# Patient Record
Sex: Male | Born: 1940
Health system: Southern US, Community
[De-identification: ages and names within clinical notes are randomized; demographics above are authoritative.]

## PROBLEM LIST (undated history)

## (undated) DIAGNOSIS — E785 Hyperlipidemia, unspecified: Secondary | ICD-10-CM

## (undated) DIAGNOSIS — I2510 Atherosclerotic heart disease of native coronary artery without angina pectoris: Secondary | ICD-10-CM

## (undated) DIAGNOSIS — I671 Cerebral aneurysm, nonruptured: Secondary | ICD-10-CM

## (undated) DIAGNOSIS — I10 Essential (primary) hypertension: Secondary | ICD-10-CM

## (undated) DIAGNOSIS — I252 Old myocardial infarction: Secondary | ICD-10-CM

## (undated) HISTORY — DX: Atherosclerotic heart disease of native coronary artery without angina pectoris: I25.10

## (undated) HISTORY — DX: Hyperlipidemia, unspecified: E78.5

## (undated) HISTORY — DX: Cerebral aneurysm, nonruptured: I67.1

## (undated) HISTORY — DX: Old myocardial infarction: I25.2

## (undated) HISTORY — PX: LUMBAR DISC SURGERY: SHX700

## (undated) HISTORY — PX: MASTECTOMY: SHX3

## (undated) HISTORY — DX: Essential (primary) hypertension: I10

## (undated) HISTORY — PX: CORONARY STENT PLACEMENT: SHX1402

## (undated) HISTORY — PX: HERNIA REPAIR: SHX51

---

## 2002-07-02 HISTORY — PX: CARDIOVASCULAR STRESS TEST: SHX262

## 2003-02-07 HISTORY — PX: CORONARY STENT PLACEMENT: SHX1402

## 2006-10-12 HISTORY — PX: CARDIAC CATHETERIZATION: SHX172

## 2008-11-23 HISTORY — PX: TRANSTHORACIC ECHOCARDIOGRAM: SHX275

## 2009-07-16 ENCOUNTER — Inpatient Hospital Stay (HOSPITAL_COMMUNITY): Admission: RE | Admit: 2009-07-16 | Discharge: 2009-07-19 | Payer: Self-pay | Admitting: Cardiology

## 2009-07-16 HISTORY — PX: CARDIAC CATHETERIZATION: SHX172

## 2009-10-25 ENCOUNTER — Ambulatory Visit: Payer: Self-pay | Admitting: Cardiology

## 2010-02-28 ENCOUNTER — Ambulatory Visit: Payer: Self-pay | Admitting: Cardiology

## 2010-04-25 LAB — URINALYSIS, ROUTINE W REFLEX MICROSCOPIC
Bilirubin Urine: NEGATIVE
Glucose, UA: NEGATIVE mg/dL
Ketones, ur: NEGATIVE mg/dL
Leukocytes, UA: NEGATIVE
Nitrite: NEGATIVE
Protein, ur: NEGATIVE mg/dL
Specific Gravity, Urine: 1.018 (ref 1.005–1.030)
Urobilinogen, UA: 0.2 mg/dL (ref 0.0–1.0)
pH: 7 (ref 5.0–8.0)

## 2010-04-25 LAB — COMPREHENSIVE METABOLIC PANEL
ALT: 20 U/L (ref 0–53)
AST: 24 U/L (ref 0–37)
Albumin: 3.7 g/dL (ref 3.5–5.2)
Alkaline Phosphatase: 50 U/L (ref 39–117)
BUN: 13 mg/dL (ref 6–23)
CO2: 26 mEq/L (ref 19–32)
Calcium: 8.5 mg/dL (ref 8.4–10.5)
Chloride: 109 mEq/L (ref 96–112)
Creatinine, Ser: 1.52 mg/dL — ABNORMAL HIGH (ref 0.4–1.5)
GFR calc Af Amer: 55 mL/min — ABNORMAL LOW (ref >60)
GFR calc non Af Amer: 46 mL/min — ABNORMAL LOW (ref >60)
Glucose, Bld: 163 mg/dL — ABNORMAL HIGH (ref 70–99)
Potassium: 4.1 mEq/L (ref 3.5–5.1)
Sodium: 138 mEq/L (ref 135–145)
Total Bilirubin: 0.4 mg/dL (ref 0.3–1.2)
Total Protein: 6 g/dL (ref 6.0–8.3)

## 2010-04-25 LAB — CARDIAC PANEL(CRET KIN+CKTOT+MB+TROPI)
CK, MB: 11.9 ng/mL (ref 0.3–4.0)
CK, MB: 62.4 ng/mL (ref 0.3–4.0)
CK, MB: 84.5 ng/mL (ref 0.3–4.0)
Relative Index: 14.4 — ABNORMAL HIGH (ref 0.0–2.5)
Relative Index: 14.6 — ABNORMAL HIGH (ref 0.0–2.5)
Relative Index: 9.1 — ABNORMAL HIGH (ref 0.0–2.5)
Total CK: 131 U/L (ref 7–232)
Total CK: 432 U/L — ABNORMAL HIGH (ref 7–232)
Total CK: 577 U/L — ABNORMAL HIGH (ref 7–232)
Troponin I: 0.94 ng/mL (ref 0.00–0.06)
Troponin I: 11.18 ng/mL (ref 0.00–0.06)
Troponin I: 11.9 ng/mL (ref 0.00–0.06)

## 2010-04-25 LAB — URINE MICROSCOPIC-ADD ON

## 2010-04-25 LAB — BASIC METABOLIC PANEL
BUN: 12 mg/dL (ref 6–23)
CO2: 29 mEq/L (ref 19–32)
Calcium: 8.7 mg/dL (ref 8.4–10.5)
Chloride: 101 mEq/L (ref 96–112)
Creatinine, Ser: 1.21 mg/dL (ref 0.4–1.5)
GFR calc Af Amer: >60 mL/min (ref >60)
GFR calc non Af Amer: 59 mL/min — ABNORMAL LOW (ref >60)
Glucose, Bld: 104 mg/dL — ABNORMAL HIGH (ref 70–99)
Potassium: 3.8 mEq/L (ref 3.5–5.1)
Sodium: 136 mEq/L (ref 135–145)

## 2010-04-25 LAB — LIPID PANEL
Cholesterol: 143 mg/dL (ref 0–200)
HDL: 56 mg/dL (ref >39)
LDL Cholesterol: 80 mg/dL (ref 0–99)
Total CHOL/HDL Ratio: 2.6 RATIO
Triglycerides: 33 mg/dL (ref <150)
VLDL: 7 mg/dL (ref 0–40)

## 2010-04-25 LAB — CBC
HCT: 36.8 % — ABNORMAL LOW (ref 39.0–52.0)
HCT: 38.8 % — ABNORMAL LOW (ref 39.0–52.0)
Hemoglobin: 12.7 g/dL — ABNORMAL LOW (ref 13.0–17.0)
Hemoglobin: 13.1 g/dL (ref 13.0–17.0)
MCHC: 33.6 g/dL (ref 30.0–36.0)
MCHC: 34.6 g/dL (ref 30.0–36.0)
MCV: 91.2 fL (ref 78.0–100.0)
MCV: 92 fL (ref 78.0–100.0)
Platelets: 240 10*3/uL (ref 150–400)
Platelets: 265 10*3/uL (ref 150–400)
RBC: 4.03 MIL/uL — ABNORMAL LOW (ref 4.22–5.81)
RBC: 4.22 MIL/uL (ref 4.22–5.81)
RDW: 14.2 % (ref 11.5–15.5)
RDW: 14.5 % (ref 11.5–15.5)
WBC: 11 10*3/uL — ABNORMAL HIGH (ref 4.0–10.5)
WBC: 18.8 10*3/uL — ABNORMAL HIGH (ref 4.0–10.5)

## 2010-04-25 LAB — HEMOGLOBIN A1C
Hgb A1c MFr Bld: 6.1 % — ABNORMAL HIGH (ref <5.7)
Mean Plasma Glucose: 128 mg/dL — ABNORMAL HIGH (ref <117)

## 2010-04-25 LAB — MRSA PCR SCREENING: MRSA by PCR: NEGATIVE

## 2010-05-03 ENCOUNTER — Other Ambulatory Visit: Payer: Self-pay | Admitting: *Deleted

## 2010-05-03 NOTE — Telephone Encounter (Signed)
Wife called requesting refills on protonix and metoprolol. Advised refilled 05/02/10

## 2010-06-21 ENCOUNTER — Telehealth: Payer: Self-pay | Admitting: Cardiology

## 2010-06-21 NOTE — Telephone Encounter (Signed)
Wife called stating he has had some issues with lungs. His doctor wanted to know if Dr. Sw[place]iland would change his Metoprolol to another med because of his lung issues. Advised that would check w/ Dr. Swaziland and call her back. Advised Dr. Swaziland not in office today.

## 2010-06-21 NOTE — Telephone Encounter (Signed)
HE IS HAVING ISSUES WITH LUNGS. SHOULD HE COME OFF BETA BLOCKERS IF SO IS THERE SOMETHING THAT WILL DO HIM JUST AS GOOD? LUNG DR.  HAS PUT HIM ON MORE MEDS.,PRESIDONE (TAPER), 4 INHALERS (FLOVENT HSA, ATROVENT HSA, SYMBICORT 160-4.5,STIRVIA , ANTIBOTIC ( AZITHROMYCIN 250MG ). CHEST X-RAY AND SHOWED LOWER AND UPPER RIGHT LUNG NODULES. GOING BACK ON June 15 FOR CT.

## 2010-06-24 ENCOUNTER — Telehealth: Payer: Self-pay | Admitting: *Deleted

## 2010-06-24 NOTE — Telephone Encounter (Signed)
Spoke w/ Dr. [place]iland who stated he could stop Metoprolol. Spoke w/ wife who states she and her husband have decided not to stop medication.  Advised to continue to monitor BP and HR. Will call back if has further questions.

## 2010-08-05 ENCOUNTER — Encounter: Payer: Self-pay | Admitting: Cardiology

## 2010-08-24 ENCOUNTER — Other Ambulatory Visit: Payer: Self-pay | Admitting: Cardiology

## 2010-08-24 NOTE — Telephone Encounter (Signed)
escribe medication per fax request  

## 2010-09-29 ENCOUNTER — Encounter: Payer: Self-pay | Admitting: Cardiology

## 2010-09-29 ENCOUNTER — Ambulatory Visit (INDEPENDENT_AMBULATORY_CARE_PROVIDER_SITE_OTHER): Payer: Medicare Other | Admitting: Cardiology

## 2010-09-29 VITALS — BP 120/70 | HR 48 | Ht 71.0 in | Wt 173.6 lb

## 2010-09-29 DIAGNOSIS — I671 Cerebral aneurysm, nonruptured: Secondary | ICD-10-CM | POA: Insufficient documentation

## 2010-09-29 DIAGNOSIS — I252 Old myocardial infarction: Secondary | ICD-10-CM | POA: Insufficient documentation

## 2010-09-29 DIAGNOSIS — E785 Hyperlipidemia, unspecified: Secondary | ICD-10-CM | POA: Insufficient documentation

## 2010-09-29 DIAGNOSIS — I2510 Atherosclerotic heart disease of native coronary artery without angina pectoris: Secondary | ICD-10-CM

## 2010-09-29 DIAGNOSIS — I10 Essential (primary) hypertension: Secondary | ICD-10-CM

## 2010-09-29 NOTE — Patient Instructions (Signed)
We will schedule you for a nuclear stress test.  Continue your current medications except hold your metoprolol the day of your stress test.   I will see you again in 6 months and check fasting lab work then.

## 2010-09-29 NOTE — Assessment & Plan Note (Signed)
Blood pressure is well-controlled on amlodipine and metoprolol. °

## 2010-09-29 NOTE — Assessment & Plan Note (Signed)
Status post multiple stenting procedures as noted in the overview. I recommended a followup nuclear stress test to follow up on his coronary disease. He will continue with dual antiplatelet therapy.

## 2010-09-29 NOTE — Assessment & Plan Note (Signed)
Lipid parameters in January were acceptable. He will continue on simvastatin.
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
## 2010-10-06 ENCOUNTER — Ambulatory Visit (HOSPITAL_COMMUNITY): Payer: Medicare Other | Attending: Cardiology | Admitting: Radiology

## 2010-10-06 VITALS — Ht 71.0 in | Wt 171.0 lb

## 2010-10-06 DIAGNOSIS — I2510 Atherosclerotic heart disease of native coronary artery without angina pectoris: Secondary | ICD-10-CM | POA: Insufficient documentation

## 2010-10-06 DIAGNOSIS — R079 Chest pain, unspecified: Secondary | ICD-10-CM

## 2010-10-06 DIAGNOSIS — R0989 Other specified symptoms and signs involving the circulatory and respiratory systems: Secondary | ICD-10-CM

## 2010-10-06 MED ORDER — TECHNETIUM TC 99M TETROFOSMIN IV KIT
11.0000 | PACK | Freq: Once | INTRAVENOUS | Status: AC | PRN
  Administered 2010-10-06: 11 via INTRAVENOUS

## 2010-10-06 MED ORDER — TECHNETIUM TC 99M TETROFOSMIN IV KIT
33.0000 | PACK | Freq: Once | INTRAVENOUS | Status: AC | PRN
  Administered 2010-10-06: 33 via INTRAVENOUS

## 2010-10-06 MED ORDER — REGADENOSON 0.4 MG/5ML IV SOLN
0.4000 mg | Freq: Once | INTRAVENOUS | Status: AC
  Administered 2010-10-06: 0.4 mg via INTRAVENOUS

## 2010-10-06 NOTE — Progress Notes (Signed)
[facility] SITE 3 NUCLEAR MED [address] Kristopher Gonzalez [phone]  Cardiology Nuclear Med Kristopher Gonzalez is a 70 y.o. male [id number] 10/22/1940   Nuclear Med Background Indication for Stress Test:  Evaluation for Ischemia and Stent Patency History:  Asthma,5/10 Echo-EF-55% ,6/11 Heart Catheterization-EF 65%, Myocardial Infarction x 4, 2004 Myocardial Perfusion Study and 6/11 Stents-RCA Cardiac Risk Factors: History of Smoking, Hypertension and Lipids  Symptoms:  Chest Pain (last date of chest discomfort >2 weeks ago) and DOE   Nuclear Pre-Procedure Caffeine/Decaff Intake:  None NPO After: 6:30am   Lungs:  Clear IV 0.9% NS with Angio Cath:  20g  IV Site: L Forearm  IV Started by:  Stanton Kidney, EMT-P  Chest Size (in):  42 Cup Size: n/a  Height: 5\' 11"  (1.803 m)  Weight:  171 lb (77.565 kg)  BMI:  Body mass index is 23.85 kg/(m^2). Tech Comments:  Metoprolol held > 24 hours, per patient.    Nuclear Med Study 1 or 2 day study: 1 day  Stress Test Type:  Treadmill/Lexiscan  Reading MD: Dietrich Pates, MD  Order Authorizing Provider:  Dr. Peter Swaziland  Resting Radionuclide: Technetium 36m Tetrofosmin  Resting Radionuclide Dose: 11.0 mCi   Stress Radionuclide:  Technetium 21m Tetrofosmin  Stress Radionuclide Dose: 33.0 mCi           Stress Protocol Rest HR: 49 Stress HR: 107  Rest BP: 137/65 Stress BP: 164/57  Exercise Time (min): 8:55 METS: 8.5   Predicted Max HR: 149 bpm % Max HR: 71.81 bpm Rate Pressure Product: 21308   Dose of Adenosine (mg):  n/a Dose of Lexiscan: 0.4 mg  Dose of Atropine (mg): n/a Dose of Dobutamine: n/a mcg/kg/min (at max HR)  Stress Test Technologist: Bonnita Levan, RN  Nuclear Technologist:  Doyne Keel, CNMT     Rest Procedure:  Myocardial perfusion imaging was performed at rest 45 minutes following the intravenous administration of Technetium 63m Tetrofosmin. Rest ECG: Sinus Bradycardia  Stress Procedure:   The patient attempted to walk the treadmill, utilizing the Bruce protocol for 8:00, but was unable to achieve his target heart rate. He was then given IV Lexiscan 0.4 mg over 15-seconds with concurrent low level exercise and then Technetium 89m Tetrofosmin was injected at 30-seconds while the patient continued walking one more minute.  There were no significant changes with Lexiscan. The patient complained of CP (9/10) while walking, which resolved with recovery. He also had a hypotensive response initially in recovery with his BP dropping to 78/45.  Quantitative spect images were obtained after a 45-minute delay. Stress ECG: No significant change from baseline ECG  QPS Raw Data Images:  Images were motion corrected.  Soft tissue (diaphragm) underlies heart. Stress Images:  Normal perfusion with minimal apical thinning. Rest Images:  Comparison with the stress images reveals no significant change. Subtraction (SDS):  No evidence of ischemia. Transient Ischemic Dilatation (Normal <1.22):  0.99 Lung/Heart Ratio (Normal <0.45):  0.37  Quantitative Gated Spect Images QGS EDV:  80 ml QGS ESV:  27 ml QGS cine images:  NL LV Function; NL Wall Motion QGS EF: 66%  Impression Exercise Capacity:  Patient unable to reach target HR.  Switched to [employer]. BP Response:   After lexiscan became transiently hypotensive. Clinical Symptoms:  Significant chest pain. ECG Impression:  No significant ST segment change suggestive of ischemia. Comparison with Prior Nuclear Study: No change from previous report.  Overall Impression:  Clinically positive, electrically negative  for ischemia.  Myoview with normal perfusion and minimal apical thinning. Dietrich Pates

## 2010-10-11 ENCOUNTER — Telehealth: Payer: Self-pay | Admitting: *Deleted

## 2010-10-11 NOTE — Telephone Encounter (Signed)
Message copied by Lorayne Bender on Tue Oct 11, 2010 12:01 PM ------      Message from: [place], PETER M      Created: Fri Oct 07, 2010 11:55 AM       Normal perfusion imaging. EF is normal. Please report.      Theron Arista [place]

## 2010-10-11 NOTE — Telephone Encounter (Signed)
Notified of Stress test results. Will send copy to Dr. Jennette Bill in [place] at fax [phone]; phone [phone]

## 2010-10-26 ENCOUNTER — Other Ambulatory Visit: Payer: Self-pay | Admitting: Cardiology

## 2010-10-26 NOTE — Telephone Encounter (Signed)
Refilled Meds from fax  

## 2011-03-29 ENCOUNTER — Other Ambulatory Visit: Payer: Self-pay | Admitting: Cardiology

## 2011-04-27 ENCOUNTER — Other Ambulatory Visit: Payer: Self-pay | Admitting: Cardiology

## 2011-04-27 NOTE — Telephone Encounter (Signed)
Refilled protonix and metoprolol

## 2011-05-10 ENCOUNTER — Ambulatory Visit (INDEPENDENT_AMBULATORY_CARE_PROVIDER_SITE_OTHER): Payer: Medicare Other | Admitting: Cardiology

## 2011-05-10 ENCOUNTER — Encounter: Payer: Self-pay | Admitting: Cardiology

## 2011-05-10 VITALS — BP 126/70 | HR 52 | Ht 71.0 in | Wt 174.6 lb

## 2011-05-10 DIAGNOSIS — I2510 Atherosclerotic heart disease of native coronary artery without angina pectoris: Secondary | ICD-10-CM

## 2011-05-10 DIAGNOSIS — I10 Essential (primary) hypertension: Secondary | ICD-10-CM

## 2011-05-10 DIAGNOSIS — E785 Hyperlipidemia, unspecified: Secondary | ICD-10-CM

## 2011-05-10 NOTE — Patient Instructions (Signed)
Continue your current medication  Get your fasting lab work- chemistries and lipid panel  I will see you again in 6 months.  You can stop your Plavix 7 days before your colonscopy.

## 2011-05-10 NOTE — Assessment & Plan Note (Signed)
Since his Zocor dose was reduced I requested that he have a fasting lipid panel done. If he is not at target we may need to consider alternative statin therapy.
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
## 2011-05-10 NOTE — Assessment & Plan Note (Signed)
Patient is really not have any significant angina. His last stress test looked fairly good. He admits that he hasn't been exercising much so encouraged him to resume more aerobic activity. We will continue with his current antianginal therapy and followup again in 6 months. He may stop his Plavix for 7 days for his colonoscopy.

## 2011-10-02 ENCOUNTER — Other Ambulatory Visit: Payer: Self-pay | Admitting: Cardiology

## 2011-11-01 ENCOUNTER — Other Ambulatory Visit: Payer: Self-pay | Admitting: Cardiology

## 2012-04-05 ENCOUNTER — Other Ambulatory Visit: Payer: Self-pay | Admitting: Cardiology

## 2012-06-02 ENCOUNTER — Other Ambulatory Visit: Payer: Self-pay | Admitting: Cardiology

## 2012-07-02 ENCOUNTER — Other Ambulatory Visit: Payer: Self-pay | Admitting: Cardiology

## 2012-07-02 ENCOUNTER — Telehealth: Payer: Self-pay | Admitting: Cardiology

## 2012-07-02 NOTE — Telephone Encounter (Signed)
LMTCB 07/02/12@1550 

## 2012-07-02 NOTE — Telephone Encounter (Signed)
New problem    Pt wanted to schedule an appt w/dr jordan-1st available for dr [place] is aug 1-offered pt to see a pa but he refused

## 2012-07-03 NOTE — Telephone Encounter (Signed)
clopidogrel (PLAVIX) 75 MG tablet  TAKE ONE TABLET BY MOUTH EVERY DAY WITH MEALS   30 tablet   metoprolol succinate (TOPROL-XL) 25 MG 24 hr tablet  TAKE ONE TABLET BY MOUTH EVERY DAY   30 tablet   pantoprazole (PROTONIX) 40 MG tablet  TAKE ONE TABLET BY MOUTH EVERY DAY   30 tablet    Patient Instructions  Continue your current medication Get your fasting lab work- chemistries and lipid panel I will see you again in 6 months. You can stop your Plavix 7 days before your colonscopy. Chart Reviewed By  Charna Elizabeth, LPN  on 05/11/4096  1:53 PM  Previous Visit  Provider Department Encounter #  04/27/2011  2:04 PM Peter [place]and, MD [facility] [id number]   Pt has appointment scheduled for September 06, 2012 with Dr. Sw[place]iland

## 2012-07-25 ENCOUNTER — Encounter: Payer: Self-pay | Admitting: Cardiology

## 2012-09-06 ENCOUNTER — Ambulatory Visit (INDEPENDENT_AMBULATORY_CARE_PROVIDER_SITE_OTHER): Payer: Medicare Other | Admitting: Cardiology

## 2012-09-06 ENCOUNTER — Encounter: Payer: Self-pay | Admitting: Cardiology

## 2012-09-06 VITALS — BP 130/62 | HR 53 | Ht 71.0 in | Wt 171.8 lb

## 2012-09-06 DIAGNOSIS — E785 Hyperlipidemia, unspecified: Secondary | ICD-10-CM

## 2012-09-06 DIAGNOSIS — I2510 Atherosclerotic heart disease of native coronary artery without angina pectoris: Secondary | ICD-10-CM

## 2012-09-06 DIAGNOSIS — I10 Essential (primary) hypertension: Secondary | ICD-10-CM

## 2012-09-06 DIAGNOSIS — I252 Old myocardial infarction: Secondary | ICD-10-CM
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
## 2012-09-06 NOTE — Patient Instructions (Signed)
Try and get more aerobic exercise  Continue your current therapy  We will request a copy of your lab work from Dr. Carolynn Comment in [place] [address].  I will see you in one year.

## 2012-09-11 ENCOUNTER — Other Ambulatory Visit: Payer: Self-pay

## 2012-10-06 ENCOUNTER — Other Ambulatory Visit: Payer: Self-pay | Admitting: Cardiology

## 2012-12-12 ENCOUNTER — Other Ambulatory Visit: Payer: Self-pay

## 2013-03-10 ENCOUNTER — Other Ambulatory Visit: Payer: Self-pay

## 2013-03-10 MED ORDER — METOPROLOL SUCCINATE ER 25 MG PO TB24: ORAL_TABLET | ORAL | Status: DC

## 2014-03-05 ENCOUNTER — Other Ambulatory Visit: Payer: Self-pay | Admitting: *Deleted

## 2014-03-05 MED ORDER — METOPROLOL SUCCINATE ER 25 MG PO TB24: ORAL_TABLET | ORAL | Status: DC

## 2014-03-06 ENCOUNTER — Telehealth: Payer: Self-pay | Admitting: Cardiology

## 2014-03-06 NOTE — Telephone Encounter (Signed)
°  1. Which medications need to be refilled? Amlodipine  2. Which pharmacy is medication to be sent to? [facility] in [place]  3. Do they need a 30 day or 90 day supply? 90 days   4. Would they like a call back once the medication has been sent to the pharmacy? Yes because he says that he took his last one yesterday

## 2014-03-09 MED ORDER — AMLODIPINE BESYLATE 5 MG PO TABS: 5.0000 mg | ORAL_TABLET | Freq: Two times a day (BID) | ORAL | Status: DC

## 2014-03-09 NOTE — Telephone Encounter (Signed)
Has this been taken care of?

## 2014-03-09 NOTE — Telephone Encounter (Signed)
Amlodipine refilled.  Patient has OV with Dr. Martinique 03/16/14

## 2014-03-16 ENCOUNTER — Ambulatory Visit: Payer: Medicare Other | Admitting: Cardiology

## 2014-04-28 ENCOUNTER — Other Ambulatory Visit: Payer: Self-pay

## 2014-04-28 MED ORDER — METOPROLOL SUCCINATE ER 25 MG PO TB24: ORAL_TABLET | ORAL | Status: DC

## 2014-05-06 DIAGNOSIS — K219 Gastro-esophageal reflux disease without esophagitis: Secondary | ICD-10-CM | POA: Diagnosis not present

## 2014-05-06 DIAGNOSIS — J4521 Mild intermittent asthma with (acute) exacerbation: Secondary | ICD-10-CM | POA: Diagnosis not present

## 2014-05-06 DIAGNOSIS — I119 Hypertensive heart disease without heart failure: Secondary | ICD-10-CM | POA: Diagnosis not present

## 2014-05-06 DIAGNOSIS — E780 Pure hypercholesterolemia: Secondary | ICD-10-CM | POA: Diagnosis not present

## 2014-05-20 ENCOUNTER — Other Ambulatory Visit: Payer: Self-pay

## 2014-05-20 MED ORDER — METOPROLOL SUCCINATE ER 25 MG PO TB24: ORAL_TABLET | ORAL | Status: DC

## 2014-06-03 ENCOUNTER — Ambulatory Visit (INDEPENDENT_AMBULATORY_CARE_PROVIDER_SITE_OTHER): Payer: Medicare Other | Admitting: Cardiology

## 2014-06-03 ENCOUNTER — Encounter: Payer: Self-pay | Admitting: Cardiology

## 2014-06-03 VITALS — BP 112/60 | HR 49 | Ht 69.0 in | Wt 148.0 lb

## 2014-06-03 DIAGNOSIS — E785 Hyperlipidemia, unspecified: Secondary | ICD-10-CM | POA: Diagnosis not present

## 2014-06-03 DIAGNOSIS — I252 Old myocardial infarction: Secondary | ICD-10-CM

## 2014-06-03 DIAGNOSIS — I10 Essential (primary) hypertension: Secondary | ICD-10-CM | POA: Diagnosis not present

## 2014-06-03 DIAGNOSIS — I2510 Atherosclerotic heart disease of native coronary artery without angina pectoris: Secondary | ICD-10-CM
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
## 2014-06-03 NOTE — Patient Instructions (Addendum)
Stop taking metoprolol (Toprol XL)  Continue your other therapy  I will see you in one year

## 2014-06-29 ENCOUNTER — Telehealth: Payer: Self-pay | Admitting: *Deleted

## 2014-06-29 MED ORDER — AMLODIPINE BESYLATE 5 MG PO TABS: 5.0000 mg | ORAL_TABLET | Freq: Two times a day (BID) | ORAL | Status: DC

## 2014-06-29 NOTE — Telephone Encounter (Signed)
refill 

## 2014-07-27 ENCOUNTER — Encounter (HOSPITAL_COMMUNITY): Payer: Self-pay | Admitting: Emergency Medicine

## 2014-07-27 ENCOUNTER — Emergency Department (HOSPITAL_COMMUNITY): Payer: Medicare Other

## 2014-07-27 ENCOUNTER — Inpatient Hospital Stay (HOSPITAL_COMMUNITY)
Admission: EM | Admit: 2014-07-27 | Discharge: 2014-07-29 | DRG: 247 | Disposition: A | Discharge status: Home / Self Care | Payer: Medicare Other | Attending: Cardiology | Admitting: Cardiology

## 2014-07-27 DIAGNOSIS — M21921 Unspecified acquired deformity of right upper arm: Secondary | ICD-10-CM | POA: Diagnosis present

## 2014-07-27 DIAGNOSIS — I671 Cerebral aneurysm, nonruptured: Secondary | ICD-10-CM | POA: Diagnosis not present

## 2014-07-27 DIAGNOSIS — T82857A Stenosis of cardiac prosthetic devices, implants and grafts, initial encounter: Principal | ICD-10-CM | POA: Diagnosis present

## 2014-07-27 DIAGNOSIS — I959 Hypotension, unspecified: Secondary | ICD-10-CM | POA: Diagnosis not present

## 2014-07-27 DIAGNOSIS — I200 Unstable angina: Secondary | ICD-10-CM | POA: Diagnosis present

## 2014-07-27 DIAGNOSIS — Z87891 Personal history of nicotine dependence: Secondary | ICD-10-CM

## 2014-07-27 DIAGNOSIS — I10 Essential (primary) hypertension: Secondary | ICD-10-CM | POA: Diagnosis not present

## 2014-07-27 DIAGNOSIS — R079 Chest pain, unspecified: Secondary | ICD-10-CM | POA: Diagnosis not present

## 2014-07-27 DIAGNOSIS — Z79899 Other long term (current) drug therapy: Secondary | ICD-10-CM

## 2014-07-27 DIAGNOSIS — E440 Moderate protein-calorie malnutrition: Secondary | ICD-10-CM | POA: Diagnosis not present

## 2014-07-27 DIAGNOSIS — Z7902 Long term (current) use of antithrombotics/antiplatelets: Secondary | ICD-10-CM

## 2014-07-27 DIAGNOSIS — I25110 Atherosclerotic heart disease of native coronary artery with unstable angina pectoris: Secondary | ICD-10-CM | POA: Diagnosis present

## 2014-07-27 DIAGNOSIS — J45909 Unspecified asthma, uncomplicated: Secondary | ICD-10-CM | POA: Diagnosis present

## 2014-07-27 DIAGNOSIS — Z7982 Long term (current) use of aspirin: Secondary | ICD-10-CM

## 2014-07-27 DIAGNOSIS — I209 Angina pectoris, unspecified: Secondary | ICD-10-CM | POA: Diagnosis present

## 2014-07-27 DIAGNOSIS — I252 Old myocardial infarction: Secondary | ICD-10-CM

## 2014-07-27 DIAGNOSIS — E785 Hyperlipidemia, unspecified: Secondary | ICD-10-CM | POA: Diagnosis present

## 2014-07-27 DIAGNOSIS — Y831 Surgical operation with implant of artificial internal device as the cause of abnormal reaction of the patient, or of later complication, without mention of misadventure at the time of the procedure: Secondary | ICD-10-CM | POA: Diagnosis present

## 2014-07-27 DIAGNOSIS — Z823 Family history of stroke: Secondary | ICD-10-CM

## 2014-07-27 DIAGNOSIS — R0789 Other chest pain: Secondary | ICD-10-CM | POA: Diagnosis not present

## 2014-07-27 LAB — CBC WITH DIFFERENTIAL/PLATELET
Basophils Absolute: 0 10*3/uL (ref 0.0–0.1)
Basophils Relative: 0 % (ref 0–1)
Eosinophils Absolute: 0.1 10*3/uL (ref 0.0–0.7)
Eosinophils Relative: 2 % (ref 0–5)
HCT: 36.9 % — ABNORMAL LOW (ref 39.0–52.0)
Hemoglobin: 12.6 g/dL — ABNORMAL LOW (ref 13.0–17.0)
Lymphocytes Relative: 20 % (ref 12–46)
Lymphs Abs: 1.4 10*3/uL (ref 0.7–4.0)
MCH: 31 pg (ref 26.0–34.0)
MCHC: 34.1 g/dL (ref 30.0–36.0)
MCV: 90.9 fL (ref 78.0–100.0)
Monocytes Absolute: 0.5 10*3/uL (ref 0.1–1.0)
Monocytes Relative: 7 % (ref 3–12)
Neutro Abs: 5 10*3/uL (ref 1.7–7.7)
Neutrophils Relative %: 71 % (ref 43–77)
Platelets: 259 10*3/uL (ref 150–400)
RBC: 4.06 MIL/uL — ABNORMAL LOW (ref 4.22–5.81)
RDW: 13.7 % (ref 11.5–15.5)
WBC: 7 10*3/uL (ref 4.0–10.5)

## 2014-07-27 LAB — COMPREHENSIVE METABOLIC PANEL
ALT: 17 U/L (ref 17–63)
AST: 22 U/L (ref 15–41)
Albumin: 3.5 g/dL (ref 3.5–5.0)
Alkaline Phosphatase: 43 U/L (ref 38–126)
Anion gap: 10 (ref 5–15)
BUN: 14 mg/dL (ref 6–20)
CO2: 23 mmol/L (ref 22–32)
Calcium: 8.8 mg/dL — ABNORMAL LOW (ref 8.9–10.3)
Chloride: 104 mmol/L (ref 101–111)
Creatinine, Ser: 1.54 mg/dL — ABNORMAL HIGH (ref 0.61–1.24)
GFR calc Af Amer: 49 mL/min — ABNORMAL LOW (ref >60)
GFR calc non Af Amer: 42 mL/min — ABNORMAL LOW (ref >60)
Glucose, Bld: 140 mg/dL — ABNORMAL HIGH (ref 65–99)
Potassium: 4 mmol/L (ref 3.5–5.1)
Sodium: 137 mmol/L (ref 135–145)
Total Bilirubin: 0.6 mg/dL (ref 0.3–1.2)
Total Protein: 5.8 g/dL — ABNORMAL LOW (ref 6.5–8.1)

## 2014-07-27 LAB — I-STAT TROPONIN, ED: Troponin i, poc: 0 ng/mL (ref 0.00–0.08)

## 2014-07-27 MED ORDER — ASPIRIN 81 MG PO CHEW
243.0000 mg | CHEWABLE_TABLET | Freq: Once | ORAL | Status: AC
Start: 2014-07-27 — End: 2014-07-27
  Administered 2014-07-27: 243 mg via ORAL
  Filled 2014-07-27: qty 3

## 2014-07-27 NOTE — ED Notes (Signed)
PT to ED via [facility] c/o chest pressure. Pt was standing in line at [facility] and began feeling chest discomfort. Reports unsure if pressure was cardiac or frustration from standing in line. Pt took 1 nitro while standing with relief. After nitro dissolved, pt felt lightheaded and was assisted to the floor by a bystander. Denies LOC. EMS arrived to find pt was hypotensive. A&Ox4; pt reports pressure 1/10, denies pain.

## 2014-07-27 NOTE — ED Notes (Signed)
Pt taken to xray 

## 2014-07-27 NOTE — ED Notes (Signed)
Pt brought to ED via [facility] - complaint of chest pressure in center of chest, non radiating, denies SOB, was diaphoretic and nauseated. Took 1 nitro at home, relieved chest pressure, on EMS arrival pt was hypotensive at 98/60 - received 700 cc NS bolus in route. BP up to 118/75. HR @ 75 & regular. Pt has hx of 2 MI's, with stent placement. Martinique is pt cardiologist. Pt has 20 g in LFA. Pt is a/o x4. Pt is chest pain free on arrival.

## 2014-07-27 NOTE — ED Notes (Signed)
Updated pt on plan for admission; requesting further update; Dr.Yao aware

## 2014-07-27 NOTE — ED Provider Notes (Signed)
CSN: [id number]     Arrival date & time 07/27/14  1840 History   First MD Initiated Contact with Patient 07/27/14 1847     Chief Complaint  Patient presents with  . Chest Pain     (Consider location/radiation/quality/duration/timing/severity/associated sxs/prior Treatment) The history is provided by the patient.  Kristopher Gonzalez is a 74 y.o. male hx of CAD s/p RCA and L circumflex stents, hypertension here presenting with chest pain. He was checking out at [facility] as sudden onset of substernal chest pain. He was diaphoretic and states that was 10 out of 10 and was similar to previous heart attack. EMS was called and he was given 1 nitroglycerin and became hypotensive 98/60. He felt like she was going pass out and was given 700 mL bolus. States that chest pain is 1/10 now.    Past Medical History  Diagnosis Date  . Coronary artery disease   . MI, old   . Cerebral aneurysm   . Hyperlipidemia   . Hypertension   . Asthma    Past Surgical History  Procedure Laterality Date  . Cardiac catheterization  07/16/2009    EF 65%  . Cardiac catheterization  10/12/2006    EF 50-60%  . Coronary stent placement      RIGHT CORONARY IN 1999 USING 3.0 X 23MM AND 3.0 X 13MM DUET STENTS  . Coronary stent placement  2005    LEFT CIRCUMFLEX CORONARY WITH 2.5 X 28MM CYPHER STENT  . Lumbar disc surgery      X2  . Hernia repair    . Mastectomy    . Transthoracic echocardiogram  11/23/2008    EF 55%  . Cardiovascular stress test  07/02/2002    EF 50%  . Coronary stent placement      Mid RCA in June of 2011 with DES for in-stent restenosis.   Family History  Problem Relation Age of Onset  . Stroke Mother   . Stroke Father    History  Substance Use Topics  . Smoking status: Former Smoker    Quit date: 02/07/1972  . Smokeless tobacco: Not on file  . Alcohol Use: No    Review of Systems  Cardiovascular: Positive for chest pain.  All other systems reviewed and are negative.     Allergies   Review of patient's allergies indicates no known allergies.  Home Medications   Prior to Admission medications   Medication Sig Start Date End Date Taking? Authorizing Provider  amLODipine (NORVASC) 5 MG tablet Take 1 tablet (5 mg total) by mouth 2 (two) times daily. 06/29/14   Peter M Martinique, MD  aspirin 81 MG tablet Take 81 mg by mouth daily.      Historical Provider, MD  Budesonide-Formoterol Fumarate (SYMBICORT IN) Inhale into the lungs 2 (two) times daily.      Historical Provider, MD  clopidogrel (PLAVIX) 75 MG tablet TAKE ONE TABLET BY MOUTH ONCE DAILY WITH FOOD 10/06/12   Peter M Martinique, MD  Fluticasone Propionate, Inhal, (FLOVENT IN) Inhale into the lungs 2 (two) times daily.      Historical Provider, MD  ipratropium (ATROVENT HFA) 17 MCG/ACT inhaler Inhale 2 puffs into the lungs 2 (two) times daily.      Historical Provider, MD  pantoprazole (PROTONIX) 40 MG tablet TAKE ONE TABLET BY MOUTH ONCE DAILY 10/06/12   Peter M Martinique, MD  simvastatin (ZOCOR) 20 MG tablet Take 1 tablet by mouth daily. 02/28/14   Historical Provider, MD  traMADol Veatrice Bourbon) 50  MG tablet Take 100 mg by mouth 4 (four) times daily.      Historical Provider, MD  vitamin B-12 (CYANOCOBALAMIN) 1000 MCG tablet Take 1,000 mcg by mouth daily.      Historical Provider, MD   BP 108/63 mmHg  Pulse 61  Resp 10  Ht 5\' 10"  (1.778 m)  Wt 155 lb (70.308 kg)  BMI 22.24 kg/m2  SpO2 100% Physical Exam  Constitutional: He is oriented to person, place, and time. He appears well-developed.  Chronically ill   HENT:  Head: Normocephalic.  Mouth/Throat: Oropharynx is clear and moist.  Eyes: Conjunctivae are normal. Pupils are equal, round, and reactive to light.  Neck: Normal range of motion. Neck supple.  Cardiovascular: Normal rate, regular rhythm and normal heart sounds.   Pulmonary/Chest: Effort normal and breath sounds normal. No respiratory distress. He has no wheezes. He has no rales.  Abdominal: Soft. Bowel sounds are  normal. He exhibits no distension. There is no tenderness. There is no rebound and no guarding.  Musculoskeletal: Normal range of motion. He exhibits no edema or tenderness.  Neurological: He is alert and oriented to person, place, and time. No cranial nerve deficit. Coordination normal.  Skin: Skin is warm and dry.  Psychiatric: He has a normal mood and affect. His behavior is normal. Judgment and thought content normal.  Nursing note and vitals reviewed.   ED Course  Procedures (including critical care time) Labs Review Labs Reviewed  CBC WITH DIFFERENTIAL/PLATELET - Abnormal; Notable for the following:    RBC 4.06 (*)    Hemoglobin 12.6 (*)    HCT 36.9 (*)    All other components within normal limits  COMPREHENSIVE METABOLIC PANEL - Abnormal; Notable for the following:    Glucose, Bld 140 (*)    Creatinine, Ser 1.54 (*)    Calcium 8.8 (*)    Total Protein 5.8 (*)    GFR calc non Af Amer 42 (*)    GFR calc Af Amer 49 (*)    All other components within normal limits  I-STAT TROPOININ, ED    Imaging Review No results found.   EKG Interpretation   Date/Time:  Monday July 27 2014 18:52:35 EDT Ventricular Rate:  60 PR Interval:  167 QRS Duration: 103 QT Interval:  427 QTC Calculation: 427 R Axis:   9 Text Interpretation:  Sinus rhythm Consider inferior infarct No  significant change since last tracing Confirmed by Jaisha Villacres  MD, Elloise Roark ([id number])  on 07/27/2014 6:58:03 PM      MDM   Final diagnoses:  None    Kristopher Gonzalez is a 74 y.o. male here with chest pain, now resolved. High risk for ACS. Will get trop and likely admit for ACS.  9:01 PM Trop neg. Labs at baseline. Cardiology to admit.     Wandra Arthurs, MD 07/27/14 2101

## 2014-07-27 NOTE — ED Notes (Signed)
Pt given [place] sandwich and diet coke; NPO after midnight for possible cardiac cath in the morning

## 2014-07-27 NOTE — ED Notes (Signed)
Cardiology at bedside.

## 2014-07-27 NOTE — ED Notes (Signed)
Attempted to call report

## 2014-07-28 ENCOUNTER — Encounter (HOSPITAL_COMMUNITY)
Admission: EM | Disposition: A | Discharge status: Home / Self Care | Payer: Medicare Other | Source: Home / Self Care | Attending: Cardiology

## 2014-07-28 DIAGNOSIS — Z79899 Other long term (current) drug therapy: Secondary | ICD-10-CM | POA: Diagnosis not present

## 2014-07-28 DIAGNOSIS — I200 Unstable angina: Secondary | ICD-10-CM

## 2014-07-28 DIAGNOSIS — I25110 Atherosclerotic heart disease of native coronary artery with unstable angina pectoris: Secondary | ICD-10-CM | POA: Diagnosis not present

## 2014-07-28 DIAGNOSIS — Y831 Surgical operation with implant of artificial internal device as the cause of abnormal reaction of the patient, or of later complication, without mention of misadventure at the time of the procedure: Secondary | ICD-10-CM | POA: Diagnosis present

## 2014-07-28 DIAGNOSIS — M21921 Unspecified acquired deformity of right upper arm: Secondary | ICD-10-CM | POA: Diagnosis present

## 2014-07-28 DIAGNOSIS — Z87891 Personal history of nicotine dependence: Secondary | ICD-10-CM | POA: Diagnosis not present

## 2014-07-28 DIAGNOSIS — I252 Old myocardial infarction: Secondary | ICD-10-CM | POA: Diagnosis not present

## 2014-07-28 DIAGNOSIS — E440 Moderate protein-calorie malnutrition: Secondary | ICD-10-CM | POA: Diagnosis present

## 2014-07-28 DIAGNOSIS — J45909 Unspecified asthma, uncomplicated: Secondary | ICD-10-CM | POA: Diagnosis present

## 2014-07-28 DIAGNOSIS — Z7982 Long term (current) use of aspirin: Secondary | ICD-10-CM | POA: Diagnosis not present

## 2014-07-28 DIAGNOSIS — I10 Essential (primary) hypertension: Secondary | ICD-10-CM | POA: Diagnosis present

## 2014-07-28 DIAGNOSIS — Z7902 Long term (current) use of antithrombotics/antiplatelets: Secondary | ICD-10-CM | POA: Diagnosis not present

## 2014-07-28 DIAGNOSIS — I671 Cerebral aneurysm, nonruptured: Secondary | ICD-10-CM | POA: Diagnosis present

## 2014-07-28 DIAGNOSIS — E785 Hyperlipidemia, unspecified: Secondary | ICD-10-CM | POA: Diagnosis present

## 2014-07-28 DIAGNOSIS — Z823 Family history of stroke: Secondary | ICD-10-CM | POA: Diagnosis not present

## 2014-07-28 DIAGNOSIS — T82857A Stenosis of cardiac prosthetic devices, implants and grafts, initial encounter: Secondary | ICD-10-CM | POA: Diagnosis present

## 2014-07-28 HISTORY — PX: CARDIAC CATHETERIZATION: SHX172

## 2014-07-28 LAB — BASIC METABOLIC PANEL
Anion gap: 6 (ref 5–15)
BUN: 12 mg/dL (ref 6–20)
CO2: 25 mmol/L (ref 22–32)
Calcium: 8.1 mg/dL — ABNORMAL LOW (ref 8.9–10.3)
Chloride: 108 mmol/L (ref 101–111)
Creatinine, Ser: 1.13 mg/dL (ref 0.61–1.24)
GFR calc Af Amer: >60 mL/min (ref >60)
GFR calc non Af Amer: >60 mL/min (ref >60)
Glucose, Bld: 72 mg/dL (ref 65–99)
Potassium: 4 mmol/L (ref 3.5–5.1)
Sodium: 139 mmol/L (ref 135–145)

## 2014-07-28 LAB — TROPONIN I
Troponin I: <0.03 ng/mL (ref <0.031)
Troponin I: <0.03 ng/mL (ref <0.031)

## 2014-07-28 LAB — CBC
HCT: 38.3 % — ABNORMAL LOW (ref 39.0–52.0)
Hemoglobin: 13.1 g/dL (ref 13.0–17.0)
MCH: 31 pg (ref 26.0–34.0)
MCHC: 34.2 g/dL (ref 30.0–36.0)
MCV: 90.8 fL (ref 78.0–100.0)
Platelets: 300 10*3/uL (ref 150–400)
RBC: 4.22 MIL/uL (ref 4.22–5.81)
RDW: 13.8 % (ref 11.5–15.5)
WBC: 8.2 10*3/uL (ref 4.0–10.5)

## 2014-07-28 LAB — PROTIME-INR
INR: 1.09 (ref 0.00–1.49)
Prothrombin Time: 14.3 seconds (ref 11.6–15.2)

## 2014-07-28 LAB — HEPARIN LEVEL (UNFRACTIONATED): Heparin Unfractionated: 0.37 IU/mL (ref 0.30–0.70)

## 2014-07-28 LAB — POCT ACTIVATED CLOTTING TIME: Activated Clotting Time: 386 seconds

## 2014-07-28 SURGERY — LEFT HEART CATH AND CORONARY ANGIOGRAPHY

## 2014-07-28 MED ORDER — FENTANYL CITRATE (PF) 100 MCG/2ML IJ SOLN
INTRAMUSCULAR | Status: AC
  Filled 2014-07-28: qty 2

## 2014-07-28 MED ORDER — SODIUM CHLORIDE 0.9 % WEIGHT BASED INFUSION
3.0000 mL/kg/h | INTRAVENOUS | Status: AC
  Administered 2014-07-28: 3 mL/kg/h via INTRAVENOUS

## 2014-07-28 MED ORDER — SODIUM CHLORIDE 0.9 % WEIGHT BASED INFUSION: 1.0000 mL/kg/h | INTRAVENOUS | Status: DC

## 2014-07-28 MED ORDER — BIVALIRUDIN 250 MG IV SOLR
INTRAVENOUS | Status: AC
  Filled 2014-07-28: qty 250

## 2014-07-28 MED ORDER — BIVALIRUDIN BOLUS VIA INFUSION - CUPID
INTRAVENOUS | Status: DC | PRN
  Administered 2014-07-28: 49.425 mg via INTRAVENOUS

## 2014-07-28 MED ORDER — SODIUM CHLORIDE 0.9 % IV SOLN
250.0000 mL | INTRAVENOUS | Status: DC | PRN
Start: 2014-07-28 — End: 2014-07-29

## 2014-07-28 MED ORDER — ACETAMINOPHEN 325 MG PO TABS: 650.0000 mg | ORAL_TABLET | ORAL | Status: DC | PRN

## 2014-07-28 MED ORDER — ONDANSETRON HCL 4 MG/2ML IJ SOLN: 4.0000 mg | Freq: Four times a day (QID) | INTRAMUSCULAR | Status: DC | PRN

## 2014-07-28 MED ORDER — NITROGLYCERIN 1 MG/10 ML FOR IR/CATH LAB
INTRA_ARTERIAL | Status: DC | PRN
  Administered 2014-07-28: 200 ug

## 2014-07-28 MED ORDER — SODIUM CHLORIDE 0.9 % IJ SOLN: 3.0000 mL | INTRAMUSCULAR | Status: DC | PRN

## 2014-07-28 MED ORDER — ASPIRIN 81 MG PO TABS: 81.0000 mg | ORAL_TABLET | Freq: Every day | ORAL | Status: DC

## 2014-07-28 MED ORDER — SODIUM CHLORIDE 0.9 % IJ SOLN
3.0000 mL | Freq: Two times a day (BID) | INTRAMUSCULAR | Status: DC
  Administered 2014-07-28: 3 mL via INTRAVENOUS

## 2014-07-28 MED ORDER — IOHEXOL 350 MG/ML SOLN
INTRAVENOUS | Status: DC | PRN
  Administered 2014-07-28: 140 mL via INTRACARDIAC

## 2014-07-28 MED ORDER — HEPARIN BOLUS VIA INFUSION
3000.0000 [IU] | Freq: Once | INTRAVENOUS | Status: AC
  Administered 2014-07-28: 3000 [IU] via INTRAVENOUS
  Filled 2014-07-28: qty 3000

## 2014-07-28 MED ORDER — LACTATED RINGERS IV SOLN
INTRAVENOUS | Status: AC
Start: 2014-07-28 — End: 2014-07-28
  Administered 2014-07-28: 02:00:00 via INTRAVENOUS

## 2014-07-28 MED ORDER — NITROGLYCERIN 1 MG/10 ML FOR IR/CATH LAB
INTRA_ARTERIAL | Status: AC
  Filled 2014-07-28: qty 10

## 2014-07-28 MED ORDER — NITROGLYCERIN 0.4 MG SL SUBL: 0.4000 mg | SUBLINGUAL_TABLET | SUBLINGUAL | Status: DC | PRN

## 2014-07-28 MED ORDER — SODIUM CHLORIDE 0.9 % WEIGHT BASED INFUSION
3.0000 mL/kg/h | INTRAVENOUS | Status: DC
  Administered 2014-07-28: 3 mL/kg/h via INTRAVENOUS

## 2014-07-28 MED ORDER — MIDAZOLAM HCL 2 MG/2ML IJ SOLN
INTRAMUSCULAR | Status: AC
  Filled 2014-07-28: qty 2

## 2014-07-28 MED ORDER — TRAMADOL HCL 50 MG PO TABS
100.0000 mg | ORAL_TABLET | Freq: Four times a day (QID) | ORAL | Status: DC
  Administered 2014-07-28 – 2014-07-29 (×5): 100 mg via ORAL
  Filled 2014-07-28 (×5): qty 2

## 2014-07-28 MED ORDER — ASPIRIN 81 MG PO CHEW
81.0000 mg | CHEWABLE_TABLET | Freq: Every day | ORAL | Status: DC
  Administered 2014-07-28: 81 mg via ORAL
  Filled 2014-07-28: qty 1

## 2014-07-28 MED ORDER — SODIUM CHLORIDE 0.9 % IV SOLN: 250.0000 mL | INTRAVENOUS | Status: DC | PRN

## 2014-07-28 MED ORDER — PANTOPRAZOLE SODIUM 40 MG PO TBEC
40.0000 mg | DELAYED_RELEASE_TABLET | Freq: Every day | ORAL | Status: DC
  Administered 2014-07-28 – 2014-07-29 (×2): 40 mg via ORAL
  Filled 2014-07-28 (×2): qty 1

## 2014-07-28 MED ORDER — ASPIRIN 81 MG PO CHEW
81.0000 mg | CHEWABLE_TABLET | ORAL | Status: AC
  Administered 2014-07-28: 81 mg via ORAL
  Filled 2014-07-28: qty 1

## 2014-07-28 MED ORDER — SODIUM CHLORIDE 0.9 % IV SOLN
250.0000 mg | INTRAVENOUS | Status: DC | PRN
  Administered 2014-07-28: 1.75 mg/kg/h via INTRAVENOUS

## 2014-07-28 MED ORDER — BUDESONIDE-FORMOTEROL FUMARATE 80-4.5 MCG/ACT IN AERO
2.0000 | INHALATION_SPRAY | Freq: Two times a day (BID) | RESPIRATORY_TRACT | Status: DC
  Administered 2014-07-28 – 2014-07-29 (×3): 2 via RESPIRATORY_TRACT
  Filled 2014-07-28: qty 6.9

## 2014-07-28 MED ORDER — CLOPIDOGREL BISULFATE 75 MG PO TABS
75.0000 mg | ORAL_TABLET | Freq: Every day | ORAL | Status: DC
  Administered 2014-07-28 – 2014-07-29 (×2): 75 mg via ORAL
  Filled 2014-07-28 (×2): qty 1

## 2014-07-28 MED ORDER — CLOPIDOGREL BISULFATE 75 MG PO TABS: 75.0000 mg | ORAL_TABLET | Freq: Every day | ORAL | Status: DC

## 2014-07-28 MED ORDER — MIDAZOLAM HCL 2 MG/2ML IJ SOLN
INTRAMUSCULAR | Status: DC | PRN
  Administered 2014-07-28: 2 mg via INTRAVENOUS
  Administered 2014-07-28: 1 mg via INTRAVENOUS

## 2014-07-28 MED ORDER — SODIUM CHLORIDE 0.9 % IJ SOLN
3.0000 mL | Freq: Two times a day (BID) | INTRAMUSCULAR | Status: DC
  Administered 2014-07-28 – 2014-07-29 (×2): 3 mL via INTRAVENOUS

## 2014-07-28 MED ORDER — FENTANYL CITRATE (PF) 100 MCG/2ML IJ SOLN
INTRAMUSCULAR | Status: DC | PRN
  Administered 2014-07-28 (×3): 25 ug via INTRAVENOUS

## 2014-07-28 MED ORDER — SIMVASTATIN 20 MG PO TABS
20.0000 mg | ORAL_TABLET | Freq: Every day | ORAL | Status: DC
  Administered 2014-07-28: 19:00:00 20 mg via ORAL
  Filled 2014-07-28 (×2): qty 1

## 2014-07-28 MED ORDER — LIDOCAINE HCL (PF) 1 % IJ SOLN
INTRAMUSCULAR | Status: AC
  Filled 2014-07-28: qty 30

## 2014-07-28 MED ORDER — HEPARIN (PORCINE) IN NACL 2-0.9 UNIT/ML-% IJ SOLN
INTRAMUSCULAR | Status: AC
  Filled 2014-07-28: qty 1500

## 2014-07-28 MED ORDER — ASPIRIN 81 MG PO CHEW
81.0000 mg | CHEWABLE_TABLET | Freq: Every day | ORAL | Status: DC
  Administered 2014-07-29: 10:00:00 81 mg via ORAL
  Filled 2014-07-28: qty 1

## 2014-07-28 MED ORDER — AMLODIPINE BESYLATE 5 MG PO TABS
5.0000 mg | ORAL_TABLET | Freq: Two times a day (BID) | ORAL | Status: DC
  Administered 2014-07-28 – 2014-07-29 (×3): 5 mg via ORAL
  Filled 2014-07-28 (×4): qty 1

## 2014-07-28 MED ORDER — HEPARIN (PORCINE) IN NACL 100-0.45 UNIT/ML-% IJ SOLN
800.0000 [IU]/h | INTRAMUSCULAR | Status: DC
  Administered 2014-07-28: 800 [IU]/h via INTRAVENOUS
  Filled 2014-07-28 (×2): qty 250

## 2014-07-28 SURGICAL SUPPLY — 30 items
BALLN ANGIOSCULPT RX 2.5X6 (BALLOONS) ×2
BALLN EMERGE MR 2.0X12 (BALLOONS) ×2
BALLN ~~LOC~~ EMERGE MR 2.5X12 (BALLOONS) ×2
BALLOON ANGIOSCULPT RX 2.5X6 (BALLOONS) ×1 IMPLANT
BALLOON EMERGE MR 2.0X12 (BALLOONS) ×1 IMPLANT
BALLOON ~~LOC~~ EMERGE MR 2.5X12 (BALLOONS) ×1 IMPLANT
CATH INFINITI 5 FR JL3.5 (CATHETERS) IMPLANT
CATH INFINITI 5FR ANG PIGTAIL (CATHETERS) IMPLANT
CATH INFINITI 5FR MULTPACK ANG (CATHETERS) ×2 IMPLANT
CATH INFINITI JR4 5F (CATHETERS) IMPLANT
DEVICE RAD COMP TR BAND LRG (VASCULAR PRODUCTS) IMPLANT
DEVICE WIRE ANGIOSEAL 6FR (Vascular Products) ×2 IMPLANT
GLIDESHEATH SLEND SS 6F .021 (SHEATH) IMPLANT
GUIDE CATH RUNWAY 6FR AL 1 (CATHETERS) ×2 IMPLANT
GUIDE CATH RUNWAY 6FR CLS3.5 (CATHETERS) ×2 IMPLANT
KIT ENCORE 26 ADVANTAGE (KITS) ×2 IMPLANT
KIT HEART LEFT (KITS) ×2 IMPLANT
PACK CARDIAC CATHETERIZATION (CUSTOM PROCEDURE TRAY) ×2 IMPLANT
SHEATH PINNACLE 5F 10CM (SHEATH) IMPLANT
SHEATH PINNACLE 6F 10CM (SHEATH) ×2 IMPLANT
STENT SYNERGY DES 2.25X16 (Permanent Stent) ×2 IMPLANT
STENT SYNERGY DES 3X12 (Permanent Stent) ×2 IMPLANT
SYR MEDRAD MARK V 150ML (SYRINGE) ×2 IMPLANT
TRANSDUCER W/STOPCOCK (MISCELLANEOUS) ×2 IMPLANT
TUBING CIL FLEX 10 FLL-RA (TUBING) IMPLANT
VALVE GUARDIAN II ~~LOC~~ HEMO (MISCELLANEOUS) ×2 IMPLANT
WIRE ASAHI PROWATER 180CM (WIRE) ×2 IMPLANT
WIRE EMERALD 3MM-J .035X150CM (WIRE) ×2 IMPLANT
WIRE HI TORQ BMW 190CM (WIRE) ×2 IMPLANT
WIRE SAFE-T 1.5MM-J .035X260CM (WIRE) IMPLANT

## 2014-07-28 NOTE — Progress Notes (Signed)
ANTICOAGULATION CONSULT NOTE - Follow Up Consult  Pharmacy Consult:  Heparin Indication: chest pain/ACS  No Known Allergies  Patient Measurements: Height: 5\' 10"  (177.8 cm) Weight: 145 lb 4.8 oz (65.908 kg) (scale a) IBW/kg (Calculated) : 73 Heparin Dosing Weight: 66 kg  Vital Signs: Temp: 97.9 F (36.6 C) (06/21 0625) Temp Source: Oral (06/21 0625) BP: 140/63 mmHg (06/21 0625) Pulse Rate: 59 (06/21 0625)  Labs:  Recent Labs  07/27/14 1700 07/28/14 0120 07/28/14 0630  HGB 12.6*  --   --   HCT 36.9*  --   --   PLT 259  --   --   CREATININE 1.54*  --  1.13  TROPONINI  --  <0.03 <0.03    Estimated Creatinine Clearance: 52.6 mL/min (by C-G formula based on Cr of 1.13).     Assessment: 74 YOM with chest pain to continue on IV heparin.  Heparin level therapeutic; no bleeding reported.   Goal of Therapy:  Heparin level 0.3-0.7 units/ml Monitor platelets by anticoagulation protocol: Yes    Plan:  - Continue heparin gtt at 800 units/hr - Check confirmatory HL - Daily HL / CBC    Ellieanna Funderburg D. Mina Marble, PharmD, BCPS Pager:  [phone] 07/28/2014, 10:06 AM

## 2014-07-28 NOTE — Progress Notes (Signed)
ANTICOAGULATION CONSULT NOTE - Initial Consult  Pharmacy Consult for heparin Indication: chest pain/ACS  No Known Allergies  Patient Measurements: Height: 5\' 10"  (177.8 cm) Weight: 155 lb (70.308 kg) IBW/kg (Calculated) : 73  Vital Signs: BP: 129/89 mmHg (06/21 0000) Pulse Rate: 70 (06/21 0000)  Labs:  Recent Labs  07/27/14 1700  HGB 12.6*  HCT 36.9*  PLT 259  CREATININE 1.54*    Estimated Creatinine Clearance: 41.2 mL/min (by C-G formula based on Cr of 1.54).   Medical History: Past Medical History  Diagnosis Date  . Coronary artery disease   . MI, old   . Cerebral aneurysm   . Hyperlipidemia   . Hypertension   . Asthma      Assessment: 74yo male c/o non-radiating chest pressure associated w/ diaphoresis and nausea, relieved at home w/ NTG x1 but on EMS arrival found to be hypotensive, to begin heparin.  Goal of Therapy:  Heparin level 0.3-0.7 units/ml Monitor platelets by anticoagulation protocol: Yes   Plan:  Will give heparin 3000 units IV bolus x1 followed by gtt at 800 units/hr and monitor heparin levels and CBC.  Wynona Neat, PharmD, BCPS  07/28/2014,12:05 AM

## 2014-07-28 NOTE — H&P (Signed)
PCP:   Pcp Not In System   Primary cardiologist: Dr. Peter Martinique  Chief Complaint:  Chest pain, presyncope  HPI: [name] presented with chest pain. He has a history of coronary disease and is status post stenting of the right coronary in 1999 with bare-metal stents. He underwent stenting of the left circumflex coronary in 2005 with a Cypher stent and then had repeat stenting of the mid RCA in June of 2011 with a drug-eluting stent for in-stent restenosis. His last ischemic evaluation with Myoview study in August of 2012 was normal. He continued to do very well from myocardial ischemia perspective up until about 4 weeks ago when he started having chest pain with usual level of exertion such as light work in the backyard or pushing the cart with groceries. Usually chest pain which she described as pressure was 5 out of 10 in intensity, substernal in location, without associated diaphoresis, nausea or shortness of breath. Didn't occur an daily basis and will serve dependent on the level of exertion, it appears that patient try to somewhat pace himself. Patient reported that chest pressure resolved with rest however he reported that it lasted up to 2 hours at times. He didn't try nitroglycerin up until today when he had an episode of chest pain social with diaphoresis while waiting in the line at the grocery store. Chest pain episode was provoked by emotional stress this patient got upset because of the wait time. He took nitroglycerin which resulted in dizziness and presyncopal feeling. Patient was noted to have BP of 98/60 mm Hg by EMS. She received 700 mL crystalloid bolus resolution of his dizziness and improvement in his blood pressure. The patient was chest pain-free at the moment of my evaluation.  Patient denied active symptoms of chest pain, shortness of breath, PND orthopnea, lower extremity edema, frequent or prolonged palpitations, lower extremity edema, nausea vomiting.  Review of  Systems:  12 Systems were reviewed and were negative except mentioned in history of present illness  Past Medical History: Past Medical History  Diagnosis Date  . Coronary artery disease   . MI, old   . Cerebral aneurysm   . Hyperlipidemia   . Hypertension   . Asthma    Past Surgical History  Procedure Laterality Date  . Cardiac catheterization  07/16/2009    EF 65%  . Cardiac catheterization  10/12/2006    EF 50-60%  . Coronary stent placement      RIGHT CORONARY IN 1999 USING 3.0 X 23MM AND 3.0 X 13MM DUET STENTS  . Coronary stent placement  2005    LEFT CIRCUMFLEX CORONARY WITH 2.5 X 28MM CYPHER STENT  . Lumbar disc surgery      X2  . Hernia repair    . Mastectomy    . Transthoracic echocardiogram  11/23/2008    EF 55%  . Cardiovascular stress test  07/02/2002    EF 50%  . Coronary stent placement      Mid RCA in June of 2011 with DES for in-stent restenosis.    Medications: Prior to Admission medications   Medication Sig Start Date End Date Taking? Authorizing Provider  amLODipine (NORVASC) 5 MG tablet Take 1 tablet (5 mg total) by mouth 2 (two) times daily. 06/29/14  Yes Peter M Martinique, MD  aspirin 81 MG tablet Take 81 mg by mouth daily.     Yes Historical Provider, MD  Budesonide-Formoterol Fumarate (SYMBICORT IN) Inhale 1 puff into the lungs 2 (two) times  daily.    Yes Historical Provider, MD  clopidogrel (PLAVIX) 75 MG tablet TAKE ONE TABLET BY MOUTH ONCE DAILY WITH FOOD 10/06/12  Yes Peter M Martinique, MD  pantoprazole (PROTONIX) 40 MG tablet TAKE ONE TABLET BY MOUTH ONCE DAILY 10/06/12  Yes Peter M Martinique, MD  simvastatin (ZOCOR) 20 MG tablet Take 1 tablet by mouth daily. 02/28/14  Yes Historical Provider, MD  traMADol (ULTRAM) 50 MG tablet Take 100 mg by mouth 4 (four) times daily.     Yes Historical Provider, MD  Fluticasone Propionate, Inhal, (FLOVENT IN) Inhale into the lungs 2 (two) times daily.      Historical Provider, MD    Allergies:  No Known  Allergies  Social History:  reports that he quit smoking about 42 years ago. He does not have any smokeless tobacco history on file. He reports that he does not drink alcohol. His drug history is not on file.  Family History: Family History  Problem Relation Age of Onset  . Stroke Mother   . Stroke Father     PHYSICAL EXAM:  Filed Vitals:   07/27/14 2230 07/27/14 2300 07/27/14 2330 07/28/14 0000  BP: 144/71 143/71 133/71 129/89  Pulse: 80 75 65 70  Resp: 14 16 13 11   Height:      Weight:      SpO2: 100% 100% 100% 100%    General:  Well appearing. No respiratory difficulty HEENT: normal Neck: supple. no JVD. Carotids 2+ bilat; no bruits. No lymphadenopathy or thryomegaly appreciated. Cor: PMI nondisplaced. Regular rate & rhythm. No rubs, gallops or murmurs. Lungs: clear Abdomen: soft, nontender, nondistended. No hepatosplenomegaly. No bruits or masses. Good bowel sounds. Extremities: no cyanosis, clubbing, rash, edema Neuro: alert & oriented x 3, cranial nerves grossly intact. moves all 4 extremities w/o difficulty. Affect pleasant.  Labs on Admission:   Recent Labs  07/27/14 1700  NA 137  K 4.0  CL 104  CO2 23  GLUCOSE 140*  BUN 14  CREATININE 1.54*  CALCIUM 8.8*    Recent Labs  07/27/14 1700  AST 22  ALT 17  ALKPHOS 43  BILITOT 0.6  PROT 5.8*  ALBUMIN 3.5   No results for input(s): LIPASE, AMYLASE in the last 72 hours.  Recent Labs  07/27/14 1700  WBC 7.0  NEUTROABS 5.0  HGB 12.6*  HCT 36.9*  MCV 90.9  PLT 259    Radiological Exams on Admission (all images were personally reviewed and interpreted by me, radiology reports were reviewed as well): Dg Chest 2 View  07/27/2014   CLINICAL DATA:  Central chest pressure, with diaphoresis and nausea. Hypotension. Initial encounter.  EXAM: CHEST  2 VIEW  COMPARISON:  Chest radiograph performed 12/02/2010  FINDINGS: The lungs are well-aerated and clear. There is no evidence of focal opacification,  pleural effusion or pneumothorax. Bilateral nipple shadows are seen.  The heart is normal in size; the mediastinal contour is within normal limits. No acute osseous abnormalities are seen.  IMPRESSION: No acute cardiopulmonary process identified.   Electronically Signed   By: Garald Balding M.D.   On: 07/27/2014 21:21   EKG personally reviewed and interpreted by me:  Assessment/Plan Present on Admission:  . Unstable angina  - Nothing by mouth after midnight - Lactated Ringer's at 125 mL an hour for 12 hours giving mild bump in creatinine and profound response to nitroglycerin suggestive of possible dehydration - Left heart catheterization in the morning if creatinine is back to baseline - Aspirin, Plavix,  statin, heparin drip - Repeat troponins  Tanequa Kretz 07/28/2014, 12:03 AM

## 2014-07-28 NOTE — Progress Notes (Signed)
       Patient Name: Kristopher Gonzalez Date of Encounter: 07/28/2014    SUBJECTIVE: Progressive angina over the past month. Episodes occurring with less activity.  TELEMETRY:  NSR  Filed Vitals:   07/28/14 0015 07/28/14 0047 07/28/14 0625 07/28/14 1055  BP:  154/80 140/63 123/59  Pulse:  76 59 61  Temp: 97.8 F (36.6 C) 97.8 F (36.6 C) 97.9 F (36.6 C)   TempSrc: Oral Oral Oral   Resp:  18 18   Height:  5\' 10"  (1.778 m)    Weight:  65.908 kg (145 lb 4.8 oz)    SpO2:  100% 100%    No intake or output data in the 24 hours ending 07/28/14 1249 LABS: Basic Metabolic Panel:  Recent Labs  07/27/14 1700 07/28/14 0630  NA 137 139  K 4.0 4.0  CL 104 108  CO2 23 25  GLUCOSE 140* 72  BUN 14 12  CREATININE 1.54* 1.13  CALCIUM 8.8* 8.1*   CBC:  Recent Labs  07/27/14 1700 07/28/14 0915  WBC 7.0 8.2  NEUTROABS 5.0  --   HGB 12.6* 13.1  HCT 36.9* 38.3*  MCV 90.9 90.8  PLT 259 300   Cardiac Enzymes:  Recent Labs  07/28/14 0120 07/28/14 0630  TROPONINI <0.03 <0.03    Radiology/Studies:  CXR with no acute process  Physical Exam: Blood pressure 123/59, pulse 61, temperature 97.9 F (36.6 C), temperature source Oral, resp. rate 18, height 5\' 10"  (1.778 m), weight 65.908 kg (145 lb 4.8 oz), SpO2 100 %. Weight change:   Wt Readings from Last 3 Encounters:  07/28/14 65.908 kg (145 lb 4.8 oz)  06/03/14 67.132 kg (148 lb)  09/06/12 77.928 kg (171 lb 12.8 oz)   Cardiac without murmur or rub Chest clear Extremities Right arm is deformed and is therefore not a site for cath. Left radial pulse is intact.  ASSESSMENT:  1. CAD with history of RCA and circumflex stents, most recently 2011. Had normal myocardial perfusion study in 2012. 2. Angina pectoris, class III, over past 4 weeks. 3. Deformed right arm. 4. Cerebral aneurysm 5. Hyperlipidemia 5. Hypertension, essential  Plan:  1. Cath today from the groin or left radial depending upon the operator. 2.  Procedure and possible stenting discussed and accepted. Risk of stroke, death, MI, limb ischemia, kidney injury among other discussed in detail.  Demetrios Isaacs 07/28/2014, 12:49 PM

## 2014-07-28 NOTE — Interval H&P Note (Signed)
Cath Lab Visit (complete for each Cath Lab visit)  Clinical Evaluation Leading to the Procedure:   ACS: No.  Non-ACS:    Anginal Classification: CCS III  Anti-ischemic medical therapy: Minimal Therapy (1 class of medications)  Non-Invasive Test Results: No non-invasive testing performed  Prior CABG: No previous CABG  Ischemic Symptoms? CCS III (Marked limitation of ordinary activity) Anti-ischemic Medical Therapy? Minimal Therapy (1 class of medications) Non-invasive Test Results? No non-invasive testing performed Prior CABG? No Previous CABG   Patient Information:   1-2V CAD, no prox LAD  A (7)  Indication: 20; Score: 7   Patient Information:   1-2V-CAD with DS 50-60% With No FFR, No IVUS  I (3)  Indication: 21; Score: 3   Patient Information:   1-2V-CAD with DS 50-60% With FFR  A (7)  Indication: 22; Score: 7   Patient Information:   1-2V-CAD with DS 50-60% With FFR>0.8, IVUS not significant  I (2)  Indication: 23; Score: 2   Patient Information:   3V-CAD without LMCA With Abnormal LV systolic function  A (9)  Indication: 48; Score: 9   Patient Information:   LMCA-CAD  A (9)  Indication: 49; Score: 9   Patient Information:   2V-CAD with prox LAD PCI  A (7)  Indication: 62; Score: 7   Patient Information:   2V-CAD with prox LAD CABG  A (8)  Indication: 62; Score: 8   Patient Information:   3V-CAD without LMCA With Low CAD burden(i.e., 3 focal stenoses, low SYNTAX score) PCI  A (7)  Indication: 63; Score: 7   Patient Information:   3V-CAD without LMCA With Low CAD burden(i.e., 3 focal stenoses, low SYNTAX score) CABG  A (9)  Indication: 63; Score: 9   Patient Information:   3V-CAD without LMCA E06c - Intermediate-high CAD burden (i.e., multiple diffuse lesions, presence of CTO, or high SYNTAX score) PCI  U (4)  Indication: 64; Score: 4   Patient Information:   3V-CAD without LMCA E06c - Intermediate-high  CAD burden (i.e., multiple diffuse lesions, presence of CTO, or high SYNTAX score) CABG  A (9)  Indication: 64; Score: 9   Patient Information:   LMCA-CAD With Isolated LMCA stenosis  PCI  U (6)  Indication: 65; Score: 6   Patient Information:   LMCA-CAD With Isolated LMCA stenosis  CABG  A (9)  Indication: 65; Score: 9   Patient Information:   LMCA-CAD Additional CAD, low CAD burden (i.e., 1- to 2-vessel additional involvement, low SYNTAX score) PCI  U (5)  Indication: 66; Score: 5   Patient Information:   LMCA-CAD Additional CAD, low CAD burden (i.e., 1- to 2-vessel additional involvement, low SYNTAX score) CABG  A (9)  Indication: 66; Score: 9   Patient Information:   LMCA-CAD Additional CAD, intermediate-high CAD burden (i.e., 3-vessel involvement, presence of CTO, or high SYNTAX score) PCI  I (3)  Indication: 67; Score: 3   Patient Information:   LMCA-CAD Additional CAD, intermediate-high CAD burden (i.e., 3-vessel involvement, presence of CTO, or high SYNTAX score) CABG  A (9)  Indication: 67; Score: 9     History and Physical Interval Note:  07/28/2014 3:31 PM  Kristopher Gonzalez  has presented today for surgery, with the diagnosis of cp  The various methods of treatment have been discussed with the patient and family. After consideration of risks, benefits and other options for treatment, the patient has consented to  Procedure(s): Left Heart Cath and Coronary Angiography (N/A) as a  surgical intervention .  The patient's history has been reviewed, patient examined, no change in status, stable for surgery.  I have reviewed the patient's chart and labs.  Questions were answered to the patient's satisfaction.     Kristopher Gonzalez S.

## 2014-07-28 NOTE — Progress Notes (Signed)
Initial Nutrition Assessment  DOCUMENTATION CODES:  Non-severe (moderate) malnutrition in context of chronic illness  INTERVENTION:   (Diet advancement once medically appropriate)  NUTRITION DIAGNOSIS:  Malnutrition related to chronic illness as evidenced by estimated needs, moderate depletions of muscle mass, moderate depletion of body fat.  GOAL:  Patient will meet greater than or equal to 90% of their needs  MONITOR:  Diet advancement, Weight trends, Labs, I & O's  REASON FOR ASSESSMENT:  Malnutrition Screening Tool    ASSESSMENT: Pt has a history of coronary disease and is status post stenting of the right coronary in 1999 with bare-metal stents and stenting of the left circumflex coronary in 2005 with a Cypher stent with repeat stenting of the mid RCA in June of 2011 with a drug-eluting stent for in-stent restenosis, presents with chest pain which started 4 weeks ago.  Pt is currently NPO. Pt reports PTA he has been eating fine with at least 3 meals a day with no other difficulties. Usual body weight reported to be ~170 lbs, which he reported last weighing 2 years ago. Recently weight has been stable ~145 lbs. Pt does not know the cause for weight loss. Pt reports he is agreeable to nutritional supplements, especially if po intake is poor once diet advances. Nutrition-Focused physical exam completed. Findings are moderate fat depletion, moderate muscle depletion, and no edema.   Labs and medications reviewed.   Height:  Ht Readings from Last 1 Encounters:  07/28/14 5\' 10"  (1.778 m)    Weight:  Wt Readings from Last 1 Encounters:  07/28/14 145 lb 4.8 oz (65.908 kg)    Ideal Body Weight:  75 kg  Wt Readings from Last 10 Encounters:  07/28/14 145 lb 4.8 oz (65.908 kg)  06/03/14 148 lb (67.132 kg)  09/06/12 171 lb 12.8 oz (77.928 kg)  05/10/11 174 lb 9.6 oz (79.198 kg)  10/06/10 171 lb (77.565 kg)  09/29/10 173 lb 9.6 oz (78.744 kg)    BMI:  Body mass index  is 20.85 kg/(m^2).  Estimated Nutritional Needs:  Kcal:  1850-2050  Protein:  80-95 grams  Fluid:  1.8 - 2 L/day  Skin:  Reviewed, no issues  Diet Order:  Diet NPO time specified  EDUCATION NEEDS:  No education needs identified at this time   Intake/Output Summary (Last 24 hours) at 07/28/14 1543 Last data filed at 07/28/14 1342  Gross per 24 hour  Intake      0 ml  Output    600 ml  Net   -600 ml    Last BM:  6/19  Corrin Parker, MS, RD, LDN Pager # [phone] After hours/ weekend pager # [phone]

## 2014-07-28 NOTE — H&P (View-Only) (Signed)
       Patient Name: Kristopher Gonzalez Date of Encounter: 07/28/2014    SUBJECTIVE: Progressive angina over the past month. Episodes occurring with less activity.  TELEMETRY:  NSR  Filed Vitals:   07/28/14 0015 07/28/14 0047 07/28/14 0625 07/28/14 1055  BP:  154/80 140/63 123/59  Pulse:  76 59 61  Temp: 97.8 F (36.6 C) 97.8 F (36.6 C) 97.9 F (36.6 C)   TempSrc: Oral Oral Oral   Resp:  18 18   Height:  5\' 10"  (1.778 m)    Weight:  65.908 kg (145 lb 4.8 oz)    SpO2:  100% 100%    No intake or output data in the 24 hours ending 07/28/14 1249 LABS: Basic Metabolic Panel:  Recent Labs  07/27/14 1700 07/28/14 0630  NA 137 139  K 4.0 4.0  CL 104 108  CO2 23 25  GLUCOSE 140* 72  BUN 14 12  CREATININE 1.54* 1.13  CALCIUM 8.8* 8.1*   CBC:  Recent Labs  07/27/14 1700 07/28/14 0915  WBC 7.0 8.2  NEUTROABS 5.0  --   HGB 12.6* 13.1  HCT 36.9* 38.3*  MCV 90.9 90.8  PLT 259 300   Cardiac Enzymes:  Recent Labs  07/28/14 0120 07/28/14 0630  TROPONINI <0.03 <0.03    Radiology/Studies:  CXR with no acute process  Physical Exam: Blood pressure 123/59, pulse 61, temperature 97.9 F (36.6 C), temperature source Oral, resp. rate 18, height 5\' 10"  (1.778 m), weight 65.908 kg (145 lb 4.8 oz), SpO2 100 %. Weight change:   Wt Readings from Last 3 Encounters:  07/28/14 65.908 kg (145 lb 4.8 oz)  06/03/14 67.132 kg (148 lb)  09/06/12 77.928 kg (171 lb 12.8 oz)   Cardiac without murmur or rub Chest clear Extremities Right arm is deformed and is therefore not a site for cath. Left radial pulse is intact.  ASSESSMENT:  1. CAD with history of RCA and circumflex stents, most recently 2011. Had normal myocardial perfusion study in 2012. 2. Angina pectoris, class III, over past 4 weeks. 3. Deformed right arm. 4. Cerebral aneurysm 5. Hyperlipidemia 5. Hypertension, essential  Plan:  1. Cath today from the groin or left radial depending upon the operator. 2.  Procedure and possible stenting discussed and accepted. Risk of stroke, death, MI, limb ischemia, kidney injury among other discussed in detail.  Demetrios Isaacs 07/28/2014, 12:49 PM

## 2014-07-29 ENCOUNTER — Encounter (HOSPITAL_COMMUNITY): Payer: Self-pay | Admitting: Interventional Cardiology

## 2014-07-29 DIAGNOSIS — E440 Moderate protein-calorie malnutrition: Secondary | ICD-10-CM | POA: Insufficient documentation

## 2014-07-29 LAB — CBC
HCT: 35.4 % — ABNORMAL LOW (ref 39.0–52.0)
Hemoglobin: 12.1 g/dL — ABNORMAL LOW (ref 13.0–17.0)
MCH: 31 pg (ref 26.0–34.0)
MCHC: 34.2 g/dL (ref 30.0–36.0)
MCV: 90.8 fL (ref 78.0–100.0)
Platelets: 260 10*3/uL (ref 150–400)
RBC: 3.9 MIL/uL — ABNORMAL LOW (ref 4.22–5.81)
RDW: 13.8 % (ref 11.5–15.5)
WBC: 7.9 10*3/uL (ref 4.0–10.5)

## 2014-07-29 LAB — BASIC METABOLIC PANEL
Anion gap: 9 (ref 5–15)
BUN: 9 mg/dL (ref 6–20)
CO2: 27 mmol/L (ref 22–32)
Calcium: 8.5 mg/dL — ABNORMAL LOW (ref 8.9–10.3)
Chloride: 102 mmol/L (ref 101–111)
Creatinine, Ser: 1.17 mg/dL (ref 0.61–1.24)
GFR calc Af Amer: >60 mL/min (ref >60)
GFR calc non Af Amer: 59 mL/min — ABNORMAL LOW (ref >60)
Glucose, Bld: 80 mg/dL (ref 65–99)
Potassium: 4.3 mmol/L (ref 3.5–5.1)
Sodium: 138 mmol/L (ref 135–145)

## 2014-07-29 MED ORDER — NITROGLYCERIN 0.3 MG SL SUBL: 0.3000 mg | SUBLINGUAL_TABLET | SUBLINGUAL | Status: DC | PRN

## 2014-07-29 MED FILL — Lidocaine HCl Local Preservative Free (PF) Inj 1%: INTRAMUSCULAR | Qty: 30 | Status: AC

## 2014-07-29 MED FILL — Heparin Sodium (Porcine) 2 Unit/ML in Sodium Chloride 0.9%: INTRAMUSCULAR | Qty: 1500 | Status: AC

## 2014-07-29 NOTE — Progress Notes (Signed)
    Subjective: No Complaints.  Groin feels fine.  Ambulating well.   Objective: Vital signs in last 24 hours: Temp:  [97.5 F (36.4 C)-98.1 F (36.7 C)] 98 F (36.7 C) (06/22 0400) Pulse Rate:  [0-111] 69 (06/22 0400) Resp:  [0-23] 20 (06/22 0400) BP: (101-159)/(55-89) 126/66 mmHg (06/22 0400) SpO2:  [0 %-100 %] 99 % (06/22 0400) Weight:  [145 lb 8.1 oz (66 kg)] 145 lb 8.1 oz (66 kg) (06/22 0017) Last BM Date: 07/28/14  Intake/Output from previous day: 06/21 0701 - 06/22 0700 In: 1115.4 [P.O.:720; I.V.:395.4] Out: 3450 [Urine:3450] Intake/Output this shift:    Medications Scheduled Meds: . amLODipine  5 mg Oral BID  . aspirin  81 mg Oral Daily  . budesonide-formoterol  2 puff Inhalation BID  . clopidogrel  75 mg Oral Daily  . pantoprazole  40 mg Oral Daily  . simvastatin  20 mg Oral q1800  . sodium chloride  3 mL Intravenous Q12H  . traMADol  100 mg Oral QID   Continuous Infusions:  PRN Meds:.sodium chloride, acetaminophen, nitroGLYCERIN, ondansetron (ZOFRAN) IV, sodium chloride  PE: General appearance: alert, cooperative and no distress Lungs: Mild right rhonchi Heart: regular rate and rhythm, S1, S2 normal, no murmur, click, rub or gallop Extremities: No LEE Pulses: 2+ and symmetric Skin: Warm and dry, Right groin cath site:  Nontender, no ecchymosis or hematoma Neurologic: Grossly normal  Lab Results:   Recent Labs  07/27/14 1700 07/28/14 0915 07/29/14 0341  WBC 7.0 8.2 7.9  HGB 12.6* 13.1 12.1*  HCT 36.9* 38.3* 35.4*  PLT 259 300 260   BMET  Recent Labs  07/27/14 1700 07/28/14 0630 07/29/14 0341  NA 137 139 138  K 4.0 4.0 4.3  CL 104 108 102  CO2 23 25 27   GLUCOSE 140* 72 80  BUN 14 12 9   CREATININE 1.54* 1.13 1.17  CALCIUM 8.8* 8.1* 8.5*   PT/INR  Recent Labs  07/28/14 1315  LABPROT 14.3  INR 1.09    Assessment/Plan  Active Problems:   Unstable angina   Malnutrition of moderate degree   Deformed right arm.   Cerebral  aneurysm   Hyperlipidemia   Hypertension, essential   SP left heart cath revealing two-vessel coronary artery disease involving the Mid Cx lesion, 95% in-stent restenosis. Dist RCA lesion, 80% stenosed. He underwent successful two-vessel intervention with 2.25 x 16 Synergy drug-eluting stent placed in the distal RCA and 3.0 x 12 Synergy drug-eluting stent placed in the mid circumflex.  Normal LVEDP. Continue dual antiplatelets therapy for minimum of 1 year with ASA and plavix.  Not on a beta blocker I suspect due to asthma.    SCr stable.   On Amlodipine and Zocor.  BP controlled.  DC home today.    LOS: 1 day    Neah Sporrer PA-C 07/29/2014 7:39 AM

## 2014-07-29 NOTE — Progress Notes (Signed)
CARDIAC REHAB PHASE I   PRE:  Rate/Rhythm: 70 SR    BP: sitting 134/59    SaO2:   MODE:  Ambulation: 1000 ft   POST:  Rate/Rhythm: 86 SR    BP: sitting 155/87     SaO2:   Tolerated very well, no c/o. Ed completed/reviewed. Cannot find stent card for pt. Not interested in CRPII at this time due to caring for his wife. Will ex on his own. Understands importance of Plavix/ASA and NTG (not taking NTG while standing). [id number]   [name] Wayne CES, ACSM 07/29/2014 8:55 AM

## 2014-07-29 NOTE — Discharge Summary (Signed)
Physician Discharge Summary    Cardiologist:  [place]  Patient ID: Kristopher Gonzalez MRN: [id number] DOB/AGE: September 20, 1940 74 y.o.  Admit date: 07/27/2014 Discharge date: 07/29/2014  Admission Diagnoses:  Unstable Angina  Discharge Diagnoses:  Active Problems:   Unstable angina   Malnutrition of moderate degree   Deformed right arm.  Cerebral aneurysm  Hyperlipidemia  Hypertension, essential  Discharged Condition: stable  Hospital Course:   Kristopher Gonzalez presented with chest pain. He has a history of coronary disease and is status post stenting of the right coronary in 1999 with bare-metal stents. He underwent stenting of the left circumflex coronary in 2005 with a Cypher stent and then had repeat stenting of the mid RCA in June of 2011 with a drug-eluting stent for in-stent restenosis. His last ischemic evaluation with Myoview study in August of 2012 was normal. He continued to do very well from myocardial ischemia perspective up until about 4 weeks ago when he started having chest pain with usual level of exertion such as light work in the backyard or pushing the cart with groceries. Usually chest pain which he described as pressure was 5 out of 10 in intensity, substernal in location, without associated diaphoresis, nausea or shortness of breath. Didn't occur an daily basis and will serve dependent on the level of exertion, it appears that patient try to somewhat pace himself. Patient reported that chest pressure resolved with rest however he reported that it lasted up to 2 hours at times. He didn't try nitroglycerin up until today when he had an episode of chest pain social with diaphoresis while waiting in the line at the grocery store. Chest pain episode was provoked by emotional stress this patient got upset because of the wait time. He took nitroglycerin which resulted in dizziness and presyncopal feeling. Patient was noted to have BP of 98/60 mm Hg by EMS. She received 700 mL crystalloid  bolus resolution of his dizziness and improvement in his blood pressure. The patient was chest pain-free at the moment of my evaluation.  Patient denied active symptoms of chest pain, shortness of breath, PND orthopnea, lower extremity edema, frequent or prolonged palpitations, lower extremity edema, nausea vomiting.  He was admitted and started on lactated ringer's for possible dehydration.  ASA, plavix and IV heparin.  He ruled out for MI.  He went for a left heart cath which revealed two-vessel coronary artery disease involving the mid Cx lesion, 95% in-stent restenosis. Dist RCA lesion, 80% stenosed. He underwent successful two-vessel intervention with 2.25 x 16 Synergy drug-eluting stent placed in the distal RCA and 3.0 x 12 Synergy drug-eluting stent placed in the mid circumflex. Normal LVEDP. Continue dual antiplatelets therapy for minimum of 1 year with ASA and plavix. Not on a beta blocker I suspect due to asthma. Amlodipine and statin continued.  He did well post cath with a stable right groin cath site.  He ambulated 1023ft with cardiac rehab without problems.  The patient was seen by Dr. Tamala Julian who felt he was stable for DC home.     Consults: Cardiac rehab  Significant Diagnostic Studies:   Cardiac Panel (last 3 results)  Recent Labs  07/28/14 0120 07/28/14 0630  TROPONINI <0.03 <0.03   Dominance: Co-dominant   Left Anterior Descending   . Mid LAD lesion, 20% stenosed.   . First Diagonal Branch   . 1st Diag lesion, 60% stenosed.     Left Circumflex   . Prox Cx lesion, 20% stenosed.   . Mid Cx  lesion, 95% stenosed. The lesion was previously treated with a drug-eluting stent . Cypher 2005   . PCI: The pre-interventional distal flow is normal (TIMI 3). Pre-stent angioplasty was performed. A drug-eluting stent was placed. The strut is apposed. No post-stent angioplasty was performed. Lesion length: 8 mm. The post-interventional distal flow is normal (TIMI 3). The intervention  was successful. No complications occurred at this lesion.  . Supplies used: BALLOON ANGIOSCULPT RX 2.5X6; STENT SYNERGY DES [id number]  . There is no residual stenosis post intervention.     . First Obtuse Marginal Branch   The vessel is small in size.     Right Coronary Artery   . Mid RCA lesion, 0% stenosed. Previously placed Mid RCA drug eluting stent is patent.   . Dist RCA lesion, 80% stenosed. discrete .   [name] PCI: The pre-interventional distal flow is normal (TIMI 3). Pre-stent angioplasty was performed. A drug-eluting stent was placed. The strut is apposed. Post-stent angioplasty was performed. Maximum pressure: 16 atm. The post-interventional distal flow is normal (TIMI 3). The intervention was successful. No complications occurred at this lesion.  . Supplies used: STENT SYNERGY DES 5.46T03; BALLOON Opp EMERGE MR [id number]  . There is no residual stenosis post intervention.       Post-Intervention Diagram           Treatments: See above  Discharge Exam: Blood pressure 134/59, pulse 76, temperature 98 F (36.7 C), temperature source Oral, resp. rate 17, height $RemoveBe'5\' 10"'iCXnrsaRw$  (1.778 m), weight 145 lb 8.1 oz (66 kg), SpO2 98 %.   Disposition: 01-Home or Self Care      Discharge Instructions    Diet - low sodium heart healthy    Complete by:  As directed      Discharge instructions    Complete by:  As directed   No lifting more than a half gallon of milk or driving for three days.     Increase activity slowly    Complete by:  As directed             Medication List    STOP taking these medications        FLOVENT IN      TAKE these medications        amLODipine 5 MG tablet  Commonly known as:  NORVASC  Take 1 tablet (5 mg total) by mouth 2 (two) times daily.     aspirin 81 MG tablet  Take 81 mg by mouth daily.     clopidogrel 75 MG tablet  Commonly known as:  PLAVIX  TAKE ONE TABLET BY MOUTH ONCE DAILY WITH FOOD     nitroGLYCERIN 0.3 MG SL tablet  Commonly known  as:  NITROSTAT  Place 1 tablet (0.3 mg total) under the tongue every 5 (five) minutes as needed for chest pain.     pantoprazole 40 MG tablet  Commonly known as:  PROTONIX  TAKE ONE TABLET BY MOUTH ONCE DAILY     simvastatin 20 MG tablet  Commonly known as:  ZOCOR  Take 1 tablet by mouth daily.     SYMBICORT IN  Inhale 1 puff into the lungs 2 (two) times daily.     traMADol 50 MG tablet  Commonly known as:  ULTRAM  Take 100 mg by mouth 4 (four) times daily.       Follow-up Information    Follow up with Richardson Dopp, PA-C On 08/11/2014.   Specialties:  Physician Assistant, Radiology, Interventional Cardiology  Why:  9:30 AM   Contact information:   [address] [place] [address] [phone]      Greater than 30 minutes was spent completing the patient's discharge.    SignedTarri Fuller, [place] 07/29/2014, 12:55 PM

## 2014-08-07 ENCOUNTER — Telehealth: Payer: Self-pay

## 2014-08-07 NOTE — Telephone Encounter (Signed)
Spoke to patient received a message you wanted to see Dr.Jordan only.Stated he was recently in hospital and had a cath by a different Dr.Advised Dr.Jordan was away the week you were in hospital.Advised to keep post hospital appointment with Richardson Dopp PA 08/11/14 at 9:30 am at [facility] office.Advised Scott will schedule appt with Dr.Jordan.

## 2014-08-09 NOTE — Progress Notes (Signed)
Cardiology Office Note   Date:  08/11/2014   ID:  Kristopher Gonzalez, DOB Oct 01, 1940, MRN [id number]  PCP:   Dr. Tina Griffiths [facility], [place]) Cardiologist:  Dr. Peter Martinique     Chief Complaint  Patient presents with  . Hospitalization Follow-up    [place] >> s/p PCI  . Coronary Artery Disease     History of Present Illness: Kristopher Gonzalez is a 74 y.o. male with a hx of HTN, HL, CAD status post stenting of the RCA in 1999 with a BMS, Cypher DES to LCx in 2005 and repeat stenting to the mid RCA 07/2009 with a DES secondary to ISR. Myoview 09/2010 normal. Last seen by Dr. Martinique 06/03/14.  Admitted 6/20-6/22 with unstable angina. He went for a left heart cath which revealed two-vessel coronary artery disease involving the mid Cx lesion, 95% in-stent restenosis. Dist RCA lesion, 80% stenosed. He underwent successful two-vessel intervention with 2.25 x 16 Synergy drug-eluting stent placed in the distal RCA and 3.0 x 12 Synergy drug-eluting stent placed in the mid circumflex. Normal LVEDP. Dual antiplatelet therapy recommended for minimum of 1 year with ASA and plavix.   He returns for follow-up. Since DC, he has been doing well. He denies further chest discomfort. Denies significant dyspnea. He denies orthopnea, PND or edema. He denies syncope.   Studies/Reports Reviewed Today:  LHC 07/28/14 Dominance: Co-dominant Left Anterior Descending  Mid LAD lesion, 20% stenosed.  1st Diag lesion, 60% stenosed. Left Circumflex  Prox Cx lesion, 20% stenosed.  Mid Cx lesion, 95% stenosed. The lesion was previously treated with a drug-eluting stent . Cypher 2005 >> PCI: BALLOON ANGIOSCULPT RX 2.5X6; STENT SYNERGY DES 3X12 RCA    Mid RCA lesion, 0% stenosed. Previously placed Mid RCA drug eluting stent is patent.   Dist RCA lesion, 80% stenosed. discrete . >> PCI: STENT SYNERGY DES 2.25X16; BALLOON  EMERGE MR 2.5X12  Myoview 10/06/10 Clinically positive, electrically negative for ischemia.  Myoview with normal perfusion and minimal apical thinning. EF 66%   Past Medical History  Diagnosis Date  . Coronary artery disease   . MI, old   . Cerebral aneurysm   . Hyperlipidemia   . Hypertension   . Asthma     Past Surgical History  Procedure Laterality Date  . Cardiac catheterization  07/16/2009    EF 65%  . Cardiac catheterization  10/12/2006    EF 50-60%  . Coronary stent placement      RIGHT CORONARY IN 1999 USING 3.0 X 23MM AND 3.0 X 13MM DUET STENTS  . Coronary stent placement  2005    LEFT CIRCUMFLEX CORONARY WITH 2.5 X 28MM CYPHER STENT  . Lumbar disc surgery      X2  . Hernia repair    . Mastectomy    . Transthoracic echocardiogram  11/23/2008    EF 55%  . Cardiovascular stress test  07/02/2002    EF 50%  . Coronary stent placement      Mid RCA in June of 2011 with DES for in-stent restenosis.  . Cardiac catheterization N/A 07/28/2014    Procedure: Left Heart Cath and Coronary Angiography;  Surgeon: Jettie Booze, MD;  Location: [place] [facility];  Service: Cardiovascular;  Laterality: N/A;  . Cardiac catheterization N/A 07/28/2014    Procedure: Coronary Stent Intervention;  Surgeon: Jettie Booze, MD;  Location: [place] [facility];  Service: Cardiovascular;  Laterality: N/A;     Current Outpatient Prescriptions  Medication Sig Dispense Refill  .  amLODipine (NORVASC) 5 MG tablet Take 1 tablet (5 mg total) by mouth 2 (two) times daily. 60 tablet 11  . aspirin 81 MG tablet Take 81 mg by mouth daily.      . budesonide (PULMICORT) 180 MCG/ACT inhaler Inhale 1 puff into the lungs 2 (two) times daily.    . Budesonide-Formoterol Fumarate (SYMBICORT IN) Inhale 1 puff into the lungs 2 (two) times daily.     . clopidogrel (PLAVIX) 75 MG tablet TAKE ONE TABLET BY MOUTH ONCE DAILY WITH FOOD 30 tablet 6  . nitroGLYCERIN (NITROSTAT) 0.3 MG SL tablet Place 1 tablet (0.3 mg total) under the tongue every 5 (five) minutes as needed for chest pain. 25 tablet 12  .  pantoprazole (PROTONIX) 40 MG tablet TAKE ONE TABLET BY MOUTH ONCE DAILY 30 tablet 6  . simvastatin (ZOCOR) 20 MG tablet Take 1 tablet by mouth daily.    . Tiotropium Bromide Monohydrate (SPIRIVA HANDIHALER IN) Inhale 1 puff into the lungs 2 (two) times daily.    . traMADol (ULTRAM) 50 MG tablet Take 100 mg by mouth 4 (four) times daily.       No current facility-administered medications for this visit.    Allergies:   Review of patient's allergies indicates no known allergies.    Social History:  The patient  reports that he quit smoking about 42 years ago. He does not have any smokeless tobacco history on file. He reports that he does not drink alcohol.   Family History:  The patient's family history includes Hypertension in his father and mother; Stroke in his father and mother. There is no history of Heart attack.    ROS:   Please see the history of present illness.   Review of Systems  Constitution: Negative for fever.  Respiratory: Negative for cough.   Hematologic/Lymphatic: Negative for bleeding problem.  All other systems reviewed and are negative.     PHYSICAL EXAM: VS:  BP 125/70 mmHg  Pulse 63  Ht 5\' 10"  (1.778 m)  Wt 152 lb 9.6 oz (69.219 kg)  BMI 21.90 kg/m2    Wt Readings from Last 3 Encounters:  08/11/14 152 lb 9.6 oz (69.219 kg)  07/29/14 145 lb 8.1 oz (66 kg)  06/03/14 148 lb (67.132 kg)     GEN: Well nourished, well developed, in no acute distress HEENT: normal Neck: no JVD, no masses Cardiac:  Normal S1/S2, RRR; no murmur ,  no rubs or gallops, no edema ; R groin without hematoma or bruit; small, reducible R inguinal hernia noted Respiratory:  clear to auscultation bilaterally, no wheezing, rhonchi or rales. GI: soft, nontender, nondistended, + BS MS: no deformity or atrophy Skin: warm and dry  Neuro:  CNs II-XII intact, Strength and sensation are intact Psych: Normal affect   EKG:  EKG is ordered today.  It demonstrates:   NSR, HR 63, normal  axis   Recent Labs: 07/27/2014: ALT 17 07/29/2014: BUN 9; Creatinine, Ser 1.17; Hemoglobin 12.1*; Platelets 260; Potassium 4.3; Sodium 138      ASSESSMENT AND PLAN:  Coronary artery disease involving native coronary artery of native heart without angina pectoris:  Doing well after recent admission to the hospital with [place] s/p DES to the LCx and DES to the RCA.  He has been on Plavix for many years.  He will need to remain on dual antiplatelet Rx for a minimum of 1 year.  Continue statin.  He is not on beta-blocker due to bradycardia.  Hypertension:  Controlled.   Hyperlipidemia:  Managed by PCP. Continue statin.     Medication Changes: Current medicines are reviewed at length with the patient today.  Concerns regarding medicines are as outlined above.  The following changes have been made:   Discontinued Medications   No medications on file   Modified Medications   No medications on file   New Prescriptions   No medications on file     Labs/ tests ordered today include:   Orders Placed This Encounter  Procedures  . EKG 12-Lead     Disposition:   FU with Dr. Peter Martinique 2 mos.    Signed, Versie Starks, MHS 08/11/2014 10:17 AM    [facility] [address], [place], [place]  [address] Phone: [phone]; Fax: [phone]

## 2014-08-11 ENCOUNTER — Ambulatory Visit (INDEPENDENT_AMBULATORY_CARE_PROVIDER_SITE_OTHER): Payer: Medicare Other | Admitting: Physician Assistant

## 2014-08-11 ENCOUNTER — Encounter: Payer: Self-pay | Admitting: Physician Assistant

## 2014-08-11 VITALS — BP 125/70 | HR 63 | Ht 70.0 in | Wt 152.6 lb

## 2014-08-11 DIAGNOSIS — E785 Hyperlipidemia, unspecified: Secondary | ICD-10-CM

## 2014-08-11 DIAGNOSIS — I2510 Atherosclerotic heart disease of native coronary artery without angina pectoris: Secondary | ICD-10-CM | POA: Diagnosis not present

## 2014-08-11 DIAGNOSIS — I10 Essential (primary) hypertension: Secondary | ICD-10-CM | POA: Diagnosis not present

## 2014-08-11 DIAGNOSIS — I200 Unstable angina: Secondary | ICD-10-CM

## 2014-08-11 NOTE — Patient Instructions (Signed)
Medication Instructions:  No changes.  Labwork: None today.  Testing/Procedures: None today.  Follow-Up: Dr. Peter Martinique in 2 months.  Any Other Special Instructions Will Be Listed Below (If Applicable).

## 2014-08-17 ENCOUNTER — Other Ambulatory Visit: Payer: Self-pay | Admitting: *Deleted

## 2014-08-17 MED ORDER — AMLODIPINE BESYLATE 5 MG PO TABS: 5.0000 mg | ORAL_TABLET | Freq: Two times a day (BID) | ORAL | Status: DC

## 2014-08-17 NOTE — Telephone Encounter (Signed)
Rx called in for Amlodipine to [facility] ---- electronic refilled failed.

## 2014-08-21 ENCOUNTER — Other Ambulatory Visit: Payer: Self-pay

## 2014-08-21 MED ORDER — AMLODIPINE BESYLATE 5 MG PO TABS: 5.0000 mg | ORAL_TABLET | Freq: Two times a day (BID) | ORAL | Status: DC

## 2014-10-06 DIAGNOSIS — E1169 Type 2 diabetes mellitus with other specified complication: Secondary | ICD-10-CM | POA: Diagnosis not present

## 2014-10-06 DIAGNOSIS — N400 Enlarged prostate without lower urinary tract symptoms: Secondary | ICD-10-CM | POA: Diagnosis not present

## 2014-10-06 DIAGNOSIS — E039 Hypothyroidism, unspecified: Secondary | ICD-10-CM | POA: Diagnosis not present

## 2014-10-06 DIAGNOSIS — N390 Urinary tract infection, site not specified: Secondary | ICD-10-CM | POA: Diagnosis not present

## 2014-10-06 DIAGNOSIS — M6281 Muscle weakness (generalized): Secondary | ICD-10-CM | POA: Diagnosis not present

## 2014-10-06 DIAGNOSIS — Z5181 Encounter for therapeutic drug level monitoring: Secondary | ICD-10-CM | POA: Diagnosis not present

## 2014-10-06 DIAGNOSIS — E780 Pure hypercholesterolemia: Secondary | ICD-10-CM | POA: Diagnosis not present

## 2014-10-15 ENCOUNTER — Encounter: Payer: Self-pay | Admitting: Cardiology

## 2014-10-15 ENCOUNTER — Ambulatory Visit (INDEPENDENT_AMBULATORY_CARE_PROVIDER_SITE_OTHER): Payer: Medicare Other | Admitting: Cardiology

## 2014-10-15 VITALS — BP 168/72 | HR 69 | Ht 70.0 in | Wt 161.4 lb

## 2014-10-15 DIAGNOSIS — E785 Hyperlipidemia, unspecified: Secondary | ICD-10-CM | POA: Diagnosis not present

## 2014-10-15 DIAGNOSIS — I2510 Atherosclerotic heart disease of native coronary artery without angina pectoris: Secondary | ICD-10-CM | POA: Diagnosis not present

## 2014-10-15 DIAGNOSIS — I200 Unstable angina: Secondary | ICD-10-CM | POA: Diagnosis not present

## 2014-10-15 DIAGNOSIS — I10 Essential (primary) hypertension: Secondary | ICD-10-CM | POA: Diagnosis not present

## 2014-10-15 MED ORDER — ROSUVASTATIN CALCIUM 20 MG PO TABS: 20.0000 mg | ORAL_TABLET | Freq: Every day | ORAL | Status: DC

## 2014-10-15 NOTE — Patient Instructions (Addendum)
Continue your current therapy except we will switch simvastatin to Cresto 20 mg daily  I will see you in 6 months.

## 2014-10-15 NOTE — Progress Notes (Signed)
Cardiology Office Note   Date:  10/15/2014   ID:  Kristopher Gonzalez, DOB 09-20-1940, MRN [id number]  PCP:   Dr. Tina Gonzalez [facility], [place]) Cardiologist:  Dr. Issam Carlyon Gonzalez     Chief Complaint  Patient presents with  . Follow-up    c/o chest pain   . Edema    only when sitting for a long period of time     History of Present Illness: Kristopher Gonzalez is a 74 y.o. male with a hx of HTN, HL, CAD status post stenting of the RCA in 1999 with a BMS, Cypher DES to LCx in 2005 and repeat stenting to the mid RCA 07/2009 with a DES secondary to ISR. Myoview 09/2010 normal.  Admitted 6/20-6/22 with unstable angina. He went for a left heart cath which revealed two-vessel coronary artery disease involving the mid Cx lesion, 95% in-stent restenosis. The proximal to mid RCA stents were patent. Dist RCA lesion, 80% stenosed. He underwent successful two-vessel intervention with 2.25 x 16 Synergy drug-eluting stent placed in the distal RCA and 3.0 x 12 Synergy drug-eluting stent placed in the mid circumflex. Normal LVEDP. Dual antiplatelet therapy recommended for minimum of 1 year with ASA and plavix.   He returns for follow-up. Since DC, he has been doing well. He denies further chest discomfort. Denies significant dyspnea. He denies orthopnea, PND or edema. He denies syncope.   Studies/Reports Reviewed Today:  LHC 07/28/14 Dominance: Co-dominant Left Anterior Descending  Mid LAD lesion, 20% stenosed.  1st Diag lesion, 60% stenosed. Left Circumflex  Prox Cx lesion, 20% stenosed.  Mid Cx lesion, 95% stenosed. The lesion was previously treated with a drug-eluting stent . Cypher 2005 >> PCI: BALLOON ANGIOSCULPT RX 2.5X6; STENT SYNERGY DES 3X12 RCA    Mid RCA lesion, 0% stenosed. Previously placed Mid RCA drug eluting stent is patent.   Dist RCA lesion, 80% stenosed. discrete . >> PCI: STENT SYNERGY DES 2.25X16; BALLOON [place] EMERGE MR 2.5X12  Myoview 10/06/10 Clinically positive, electrically  negative for ischemia. Myoview with normal perfusion and minimal apical thinning. EF 66%   Past Medical History  Diagnosis Date  . Coronary artery disease   . MI, old   . Cerebral aneurysm   . Hyperlipidemia   . Hypertension   . Asthma     Past Surgical History  Procedure Laterality Date  . Cardiac catheterization  07/16/2009    EF 65%  . Cardiac catheterization  10/12/2006    EF 50-60%  . Coronary stent placement      RIGHT CORONARY IN 1999 USING 3.0 X 23MM AND 3.0 X 13MM DUET STENTS  . Coronary stent placement  2005    LEFT CIRCUMFLEX CORONARY WITH 2.5 X 28MM CYPHER STENT  . Lumbar disc surgery      X2  . Hernia repair    . Mastectomy    . Transthoracic echocardiogram  11/23/2008    EF 55%  . Cardiovascular stress test  07/02/2002    EF 50%  . Coronary stent placement      Mid RCA in June of 2011 with DES for in-stent restenosis.  . Cardiac catheterization N/A 07/28/2014    Procedure: Left Heart Cath and Coronary Angiography;  Surgeon: Jettie Booze, MD;  Location: [place] [facility];  Service: Cardiovascular;  Laterality: N/A;  . Cardiac catheterization N/A 07/28/2014    Procedure: Coronary Stent Intervention;  Surgeon: Jettie Booze, MD;  Location: [place] [facility];  Service: Cardiovascular;  Laterality: N/A;  Current Outpatient Prescriptions  Medication Sig Dispense Refill  . amLODipine (NORVASC) 5 MG tablet Take 1 tablet (5 mg total) by mouth 2 (two) times daily. 180 tablet 3  . aspirin 81 MG tablet Take 81 mg by mouth daily.      . budesonide (PULMICORT) 180 MCG/ACT inhaler Inhale 1 puff into the lungs 2 (two) times daily.    . Budesonide-Formoterol Fumarate (SYMBICORT IN) Inhale 1 puff into the lungs 2 (two) times daily.     . clopidogrel (PLAVIX) 75 MG tablet TAKE ONE TABLET BY MOUTH ONCE DAILY WITH FOOD 30 tablet 6  . nitroGLYCERIN (NITROSTAT) 0.3 MG SL tablet Place 1 tablet (0.3 mg total) under the tongue every 5 (five) minutes as needed for chest  pain. 25 tablet 12  . pantoprazole (PROTONIX) 40 MG tablet TAKE ONE TABLET BY MOUTH ONCE DAILY 30 tablet 6  . Tiotropium Bromide Monohydrate (SPIRIVA HANDIHALER IN) Inhale 1 puff into the lungs 2 (two) times daily.    . traMADol (ULTRAM) 50 MG tablet Take 100 mg by mouth 4 (four) times daily.      . rosuvastatin (CRESTOR) 20 MG tablet Take 1 tablet (20 mg total) by mouth daily. 90 tablet 3   No current facility-administered medications for this visit.    Allergies:   Review of patient's allergies indicates no known allergies.    Social History:  The patient  reports that he quit smoking about 42 years ago. He does not have any smokeless tobacco history on file. He reports that he does not drink alcohol.   Family History:  The patient's family history includes Hypertension in his father and mother; Stroke in his father and mother. There is no history of Heart attack.    ROS:   Please see the history of present illness.   Review of Systems  Constitution: Negative for fever.  Respiratory: Negative for cough.   Hematologic/Lymphatic: Negative for bleeding problem.  All other systems reviewed and are negative.     PHYSICAL EXAM: VS:  BP 168/72 mmHg  Pulse 69  Ht 5\' 10"  (1.778 m)  Wt 73.211 kg (161 lb 6.4 oz)  BMI 23.16 kg/m2    Wt Readings from Last 3 Encounters:  10/15/14 73.211 kg (161 lb 6.4 oz)  08/11/14 69.219 kg (152 lb 9.6 oz)  07/29/14 66 kg (145 lb 8.1 oz)     GEN: Well nourished, well developed, in no acute distress HEENT: normal Neck: no JVD, no masses Cardiac:  Normal S1/S2, RRR; no murmur ,  no rubs or gallops, no edema ; R groin without hematoma or bruit; small, reducible R inguinal hernia noted Respiratory:  clear to auscultation bilaterally, no wheezing, rhonchi or rales. GI: soft, nontender, nondistended, + BS MS: no deformity or atrophy Skin: warm and dry  Neuro:  CNs II-XII intact, Strength and sensation are intact Psych: Normal affect   EKG:  EKG is  not ordered today.    Recent Labs: 07/27/2014: ALT 17 07/29/2014: BUN 9; Creatinine, Ser 1.17; Hemoglobin 12.1*; Platelets 260; Potassium 4.3; Sodium 138    Labs reviewed from primary care 10/09/14: A1c 5.6%. Creatinine 1.28. Chol-181, triglycerides- 45, HDL 67, LDL 105.   ASSESSMENT AND PLAN:  1. Coronary artery disease involving native coronary artery of native heart without angina pectoris:  Doing well after recent admission to the hospital with [place] s/p DES to the LCx and DES to the RCA.  He has been on Plavix for many years.  He will need to  remain on dual antiplatelet Rx for a minimum of 1 year.   He is not on beta-blocker due to bradycardia. Given recent ACS he should be on high dose statin. Will switch simvastatin to Crestor 20 mg daily.  2. Hypertension:  Elevated today but has been under excellent control. Last week was 130/70 at primary care. Will continue amlodipine and monitor.  3. Hyperlipidemia:  As noted above    Medication Changes: Current medicines are reviewed at length with the patient today.  Concerns regarding medicines are as outlined above.  The following changes have been made:   Discontinued Medications   SIMVASTATIN (ZOCOR) 20 MG TABLET    Take 1 tablet by mouth daily.   Modified Medications   No medications on file   New Prescriptions   ROSUVASTATIN (CRESTOR) 20 MG TABLET    Take 1 tablet (20 mg total) by mouth daily.     Labs/ tests ordered today include:   No orders of the defined types were placed in this encounter.     Disposition:   FU with Dr. Corney Knighton Gonzalez 6 mos.    Signed, Kristopher Clayborn Martinique MD, [facility]    10/15/2014 12:59 PM

## 2014-10-16 ENCOUNTER — Encounter: Payer: Self-pay | Admitting: Cardiology

## 2015-06-14 ENCOUNTER — Encounter: Payer: Self-pay | Admitting: Cardiology

## 2015-06-14 ENCOUNTER — Ambulatory Visit (INDEPENDENT_AMBULATORY_CARE_PROVIDER_SITE_OTHER): Payer: Medicare Other | Admitting: Cardiology

## 2015-06-14 VITALS — BP 122/64 | HR 70 | Ht 69.0 in | Wt 145.2 lb

## 2015-06-14 DIAGNOSIS — E785 Hyperlipidemia, unspecified: Secondary | ICD-10-CM

## 2015-06-14 DIAGNOSIS — I2510 Atherosclerotic heart disease of native coronary artery without angina pectoris: Secondary | ICD-10-CM | POA: Diagnosis not present

## 2015-06-14 DIAGNOSIS — I10 Essential (primary) hypertension: Secondary | ICD-10-CM

## 2015-06-14 NOTE — Progress Notes (Signed)
Cardiology Office Note   Date:  06/14/2015   ID:  Shrish State, DOB [birth year], MRN [id number]  PCP:   Dr. Tina Griffiths [facility], [place]) Cardiologist:  Dr. Peter Martinique     No chief complaint on file.    History of Present Illness: Kristopher Gonzalez is a 75 y.o. male with a hx of HTN, HL, CAD status post stenting of the RCA in 1999 with a BMS, Cypher DES to LCx in 2005 and repeat stenting to the mid RCA 07/2009 with a DES secondary to ISR. Myoview 09/2010 normal.  Admitted 6/20-6/22/16 with unstable angina. Cardiac cath revealed two-vessel coronary artery disease involving the mid Cx lesion, 95% in-stent restenosis. The proximal to mid RCA stents were patent. Dist RCA lesion, 80% stenosed. He underwent successful two-vessel intervention with 2.25 x 16 Synergy drug-eluting stent placed in the distal RCA and 3.0 x 12 Synergy drug-eluting stent placed in the mid circumflex. Normal LVEDP. Dual antiplatelet therapy recommended for minimum of 1 year with ASA and plavix.   He returns for follow-up. He has been doing well. He denies further chest discomfort. Denies significant dyspnea. He denies orthopnea, PND or edema. He denies syncope.   Past Medical History  Diagnosis Date  . Coronary artery disease   . MI, old   . Cerebral aneurysm   . Hyperlipidemia   . Hypertension   . Asthma     Past Surgical History  Procedure Laterality Date  . Cardiac catheterization  07/16/2009    EF 65%  . Cardiac catheterization  10/12/2006    EF 50-60%  . Coronary stent placement      RIGHT CORONARY IN 1999 USING 3.0 X 23MM AND 3.0 X 13MM DUET STENTS  . Coronary stent placement  2005    LEFT CIRCUMFLEX CORONARY WITH 2.5 X 28MM CYPHER STENT  . Lumbar disc surgery      X2  . Hernia repair    . Mastectomy    . Transthoracic echocardiogram  11/23/2008    EF 55%  . Cardiovascular stress test  07/02/2002    EF 50%  . Coronary stent placement      Mid RCA in June of 2011 with DES for in-stent  restenosis.  . Cardiac catheterization N/A 07/28/2014    Procedure: Left Heart Cath and Coronary Angiography;  Surgeon: Jettie Booze, MD;  Location: [place] [facility];  Service: Cardiovascular;  Laterality: N/A;  . Cardiac catheterization N/A 07/28/2014    Procedure: Coronary Stent Intervention;  Surgeon: Jettie Booze, MD;  Location: [place] [facility];  Service: Cardiovascular;  Laterality: N/A;     Current Outpatient Prescriptions  Medication Sig Dispense Refill  . amLODipine (NORVASC) 5 MG tablet Take 1 tablet (5 mg total) by mouth 2 (two) times daily. 180 tablet 3  . aspirin 81 MG tablet Take 81 mg by mouth daily.      . budesonide (PULMICORT) 180 MCG/ACT inhaler Inhale 1 puff into the lungs 2 (two) times daily.    . Budesonide-Formoterol Fumarate (SYMBICORT IN) Inhale 1 puff into the lungs 2 (two) times daily.     . clopidogrel (PLAVIX) 75 MG tablet TAKE ONE TABLET BY MOUTH ONCE DAILY WITH FOOD 30 tablet 6  . nitroGLYCERIN (NITROSTAT) 0.3 MG SL tablet Place 1 tablet (0.3 mg total) under the tongue every 5 (five) minutes as needed for chest pain. 25 tablet 12  . pantoprazole (PROTONIX) 40 MG tablet TAKE ONE TABLET BY MOUTH ONCE DAILY 30 tablet 6  .  rosuvastatin (CRESTOR) 20 MG tablet Take 1 tablet (20 mg total) by mouth daily. 90 tablet 3  . Tiotropium Bromide Monohydrate (SPIRIVA HANDIHALER IN) Inhale 1 puff into the lungs 2 (two) times daily.    . traMADol (ULTRAM) 50 MG tablet Take 100 mg by mouth 4 (four) times daily.       No current facility-administered medications for this visit.    Allergies:   Review of patient's allergies indicates no known allergies.    Social History:  The patient  reports that he quit smoking about 43 years ago. He does not have any smokeless tobacco history on file. He reports that he does not drink alcohol.   Family History:  The patient's family history includes Hypertension in his father and mother; Stroke in his father and mother.  There is no history of Heart attack.    ROS:   Please see the history of present illness.   Review of Systems  Constitution: Negative for fever.  Respiratory: Negative for cough.   Hematologic/Lymphatic: Negative for bleeding problem.  All other systems reviewed and are negative.     PHYSICAL EXAM: VS:  There were no vitals taken for this visit.    Wt Readings from Last 3 Encounters:  10/15/14 73.211 kg (161 lb 6.4 oz)  08/11/14 69.219 kg (152 lb 9.6 oz)  07/29/14 66 kg (145 lb 8.1 oz)     GEN: Well nourished, well developed, in no acute distress HEENT: normal Neck: no JVD, no masses Cardiac:  Normal S1/S2, RRR; no murmur ,  no rubs or gallops, no edema ; R groin without hematoma or bruit; small, reducible R inguinal hernia noted Respiratory:  clear to auscultation bilaterally, no wheezing, rhonchi or rales. 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 Skin: warm and dry  Neuro:  CNs II-XII intact, Strength and sensation are intact Psych: Normal affect   EKG:  EKG is not ordered today.    Recent Labs: 07/27/2014: ALT 17 07/29/2014: BUN 9; Creatinine, Ser 1.17; Hemoglobin 12.1*; Platelets 260; Potassium 4.3; Sodium 138    ASSESSMENT AND PLAN:  1. Coronary artery disease involving native coronary artery of native heart without angina pectoris:  Doing well since last admission to the hospital with [place] s/p DES to the LCx and DES to the RCA in June 2016.  He has been on Plavix for many years.  He will need to remain on dual antiplatelet Rx for a minimum of 1 year.   He is not on beta-blocker due to bradycardia. Continue statin therapy.  2. Hypertension:  Excellent control.  3. Hyperlipidemia:  On crestor. Will check fasting lab work today.    Medication Changes: Current medicines are reviewed at length with the patient today.  Concerns regarding medicines are as outlined above.  The following changes have been made:   none   Labs/ tests ordered today include:   CMET and lipids today.  Disposition:   FU with Dr. Peter Martinique 6 mos.    Signed, Peter Martinique MD, [facility]    06/14/2015 7:40 AM

## 2015-06-14 NOTE — Patient Instructions (Signed)
Continue your current therapy  We will check blood work today  I will see you in 6 months. 

## 2015-06-15 DIAGNOSIS — I10 Essential (primary) hypertension: Secondary | ICD-10-CM | POA: Diagnosis not present

## 2015-06-15 DIAGNOSIS — E785 Hyperlipidemia, unspecified: Secondary | ICD-10-CM | POA: Diagnosis not present

## 2015-06-15 DIAGNOSIS — I2510 Atherosclerotic heart disease of native coronary artery without angina pectoris: Secondary | ICD-10-CM | POA: Diagnosis not present

## 2015-06-15 LAB — HEPATIC FUNCTION PANEL
ALT: 16 U/L (ref 9–46)
AST: 21 U/L (ref 10–35)
Albumin: 4.4 g/dL (ref 3.6–5.1)
Alkaline Phosphatase: 54 U/L (ref 40–115)
Bilirubin, Direct: 0.1 mg/dL (ref <=0.2)
Indirect Bilirubin: 0.4 mg/dL (ref 0.2–1.2)
Total Bilirubin: 0.5 mg/dL (ref 0.2–1.2)
Total Protein: 6.6 g/dL (ref 6.1–8.1)

## 2015-06-15 LAB — LIPID PANEL
Cholesterol: 138 mg/dL (ref 125–200)
HDL: 66 mg/dL (ref >=40)
LDL Cholesterol: 60 mg/dL (ref <130)
Total CHOL/HDL Ratio: 2.1 Ratio (ref <=5.0)
Triglycerides: 58 mg/dL (ref <150)
VLDL: 12 mg/dL (ref <30)

## 2015-06-15 LAB — BASIC METABOLIC PANEL
BUN: 13 mg/dL (ref 7–25)
CO2: 30 mmol/L (ref 20–31)
Calcium: 9.2 mg/dL (ref 8.6–10.3)
Chloride: 104 mmol/L (ref 98–110)
Creat: 1.14 mg/dL (ref 0.70–1.18)
Glucose, Bld: 87 mg/dL (ref 65–99)
Potassium: 4.1 mmol/L (ref 3.5–5.3)
Sodium: 142 mmol/L (ref 135–146)

## 2015-06-22 ENCOUNTER — Telehealth: Payer: Self-pay | Admitting: Cardiology

## 2015-06-22 NOTE — Telephone Encounter (Signed)
New  Message ° °Pt returned call  °

## 2015-06-22 NOTE — Telephone Encounter (Signed)
Advised patient of lab results  

## 2015-06-22 NOTE — Telephone Encounter (Signed)
-----   Message from Peter M Martinique, MD sent at 06/20/2015  9:44 AM EDT ----- Labs look excellent  Peter Martinique MD, [facility]

## 2015-08-05 DIAGNOSIS — M170 Bilateral primary osteoarthritis of knee: Secondary | ICD-10-CM | POA: Diagnosis not present

## 2015-08-05 DIAGNOSIS — M1711 Unilateral primary osteoarthritis, right knee: Secondary | ICD-10-CM | POA: Diagnosis not present

## 2015-08-05 DIAGNOSIS — M1712 Unilateral primary osteoarthritis, left knee: Secondary | ICD-10-CM | POA: Diagnosis not present

## 2015-09-14 ENCOUNTER — Other Ambulatory Visit: Payer: Self-pay | Admitting: Cardiology

## 2015-09-15 ENCOUNTER — Other Ambulatory Visit: Payer: Self-pay | Admitting: Cardiology

## 2015-09-23 DIAGNOSIS — M170 Bilateral primary osteoarthritis of knee: Secondary | ICD-10-CM | POA: Diagnosis not present

## 2015-09-23 DIAGNOSIS — M1712 Unilateral primary osteoarthritis, left knee: Secondary | ICD-10-CM | POA: Diagnosis not present

## 2015-09-23 DIAGNOSIS — M1711 Unilateral primary osteoarthritis, right knee: Secondary | ICD-10-CM | POA: Diagnosis not present

## 2015-10-01 DIAGNOSIS — M170 Bilateral primary osteoarthritis of knee: Secondary | ICD-10-CM | POA: Diagnosis not present

## 2015-10-07 DIAGNOSIS — M170 Bilateral primary osteoarthritis of knee: Secondary | ICD-10-CM | POA: Diagnosis not present

## 2016-01-31 NOTE — Progress Notes (Signed)
Cardiology Office Note   Date:  02/04/2016   ID:  Kristopher Gonzalez, DOB [birth year], MRN [id number]  PCP:   Dr. Tina Griffiths [facility], [place]) Cardiologist:  Dr. Emri Sample [place]     Chief Complaint  Patient presents with  . Coronary Artery Disease  . Hypertension     History of Present Illness: Gabryl Mccamy is a 75 y.o. male with a hx of HTN, HL, CAD status post stenting of the RCA in 1999 with a BMS, Cypher DES to LCx in 2005 and repeat stenting to the mid RCA 07/2009 with a DES secondary to ISR. Myoview 09/2010 normal.  Admitted 6/20-6/22/16 with unstable angina. Cardiac cath revealed two-vessel coronary artery disease involving the mid Cx lesion, 95% in-stent restenosis. The proximal to mid RCA stents were patent. Dist RCA lesion, 80% stenosed. He underwent successful two-vessel intervention with 2.25 x 16 Synergy drug-eluting stent placed in the distal RCA and 3.0 x 12 Synergy drug-eluting stent placed in the mid circumflex. Normal LVEDP. Dual antiplatelet therapy recommended for minimum of 1 year with ASA and plavix.   He returns for follow-up. He has been doing well. He denies further chest discomfort except for one day when he had some discomfort that lasted about 2 minutes. Denies  dyspnea. He denies orthopnea, PND or edema. He denies syncope. He is the primary caretaker of his wife.   Past Medical History:  Diagnosis Date  . Asthma   . Cerebral aneurysm   . Coronary artery disease   . Hyperlipidemia   . Hypertension   . MI, old     Past Surgical History:  Procedure Laterality Date  . CARDIAC CATHETERIZATION  07/16/2009   EF 65%  . CARDIAC CATHETERIZATION  10/12/2006   EF 50-60%  . CARDIAC CATHETERIZATION N/A 07/28/2014   Procedure: Left Heart Cath and Coronary Angiography;  Surgeon: Jettie Booze, MD;  Location: [place] [facility];  Service: Cardiovascular;  Laterality: N/A;  . CARDIAC CATHETERIZATION N/A 07/28/2014   Procedure: Coronary Stent Intervention;   Surgeon: Jettie Booze, MD;  Location: [place] [facility];  Service: Cardiovascular;  Laterality: N/A;  . CARDIOVASCULAR STRESS TEST  07/02/2002   EF 50%  . CORONARY STENT PLACEMENT     RIGHT CORONARY IN 1999 USING 3.0 X 23MM AND 3.0 X 13MM DUET STENTS  . CORONARY STENT PLACEMENT  2005   LEFT CIRCUMFLEX CORONARY WITH 2.5 X 28MM CYPHER STENT  . CORONARY STENT PLACEMENT     Mid RCA in June of 2011 with DES for in-stent restenosis.  [name] HERNIA REPAIR    . LUMBAR DISC SURGERY     X2  . MASTECTOMY    . TRANSTHORACIC ECHOCARDIOGRAM  11/23/2008   EF 55%     Current Outpatient Prescriptions  Medication Sig Dispense Refill  . amLODipine (NORVASC) 5 MG tablet TAKE 1 TABLET BY MOUTH TWICE DAILY 180 tablet 2  . aspirin 81 MG tablet Take 81 mg by mouth daily.      . budesonide (PULMICORT) 180 MCG/ACT inhaler Inhale 1 puff into the lungs 2 (two) times daily.    . Budesonide-Formoterol Fumarate (SYMBICORT IN) Inhale 1 puff into the lungs 2 (two) times daily.     . clopidogrel (PLAVIX) 75 MG tablet TAKE ONE TABLET BY MOUTH ONCE DAILY WITH FOOD 30 tablet 6  . nitroGLYCERIN (NITROSTAT) 0.3 MG SL tablet Place 1 tablet (0.3 mg total) under the tongue every 5 (five) minutes as needed for chest pain. 25 tablet 12  .  pantoprazole (PROTONIX) 40 MG tablet TAKE ONE TABLET BY MOUTH ONCE DAILY 30 tablet 6  . rosuvastatin (CRESTOR) 20 MG tablet TAKE 1 TABLET (20 MG TOTAL) BY MOUTH DAILY. 90 tablet 3  . traMADol (ULTRAM) 50 MG tablet Take 100 mg by mouth 4 (four) times daily.       No current facility-administered medications for this visit.     Allergies:   Patient has no known allergies.    Social History:  The patient  reports that he quit smoking about 44 years ago. He has never used smokeless tobacco. He reports that he does not drink alcohol.   Family History:  The patient's family history includes Hypertension in his father and mother; Stroke in his father and mother.    ROS:   Please see the  history of present illness.   Review of Systems  Constitution: Negative for fever.  Respiratory: Negative for cough.   Hematologic/Lymphatic: Negative for bleeding problem.  All other systems reviewed and are negative.     PHYSICAL EXAM: VS:  BP 134/62   Pulse 69   Ht 5\' 9"  (1.753 m)   Wt 144 lb (65.3 kg)   BMI 21.27 kg/m     Wt Readings from Last 3 Encounters:  02/04/16 144 lb (65.3 kg)  06/14/15 145 lb 4 oz (65.9 kg)  10/15/14 161 lb 6.4 oz (73.2 kg)     GEN: Well nourished, well developed, in no acute distress  HEENT: normal  Neck: no JVD, no masses Cardiac:  Normal S1/S2, RRR; no murmur ,  no rubs or gallops, no edema ; R groin without hematoma or bruit; small, reducible R inguinal hernia noted Respiratory:  clear to auscultation bilaterally, no wheezing, rhonchi or rales. 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  Skin: warm and dry  Neuro:  CNs II-XII intact, Strength and sensation are intact Psych: Normal affect   EKG:  EKG is  ordered today. NSR with normal Ecg. I have personally reviewed and interpreted this study.    Recent Labs: Lab Results  Component Value Date   WBC 7.9 07/29/2014   HGB 12.1 (L) 07/29/2014   HCT 35.4 (L) 07/29/2014   PLT 260 07/29/2014   GLUCOSE 87 06/14/2015   CHOL 138 06/14/2015   TRIG 58 06/14/2015   HDL 66 06/14/2015   LDLCALC 60 06/14/2015   ALT 16 06/14/2015   AST 21 06/14/2015   NA 142 06/14/2015   K 4.1 06/14/2015   CL 104 06/14/2015   CREATININE 1.14 06/14/2015   BUN 13 06/14/2015   CO2 30 06/14/2015   INR 1.09 07/28/2014   HGBA1C (H) 07/16/2009    6.1 (NOTE)                                                                       According to the ADA Clinical Practice Recommendations for 2011, when HbA1c is used as a screening test:   >=6.5%   Diagnostic of Diabetes Mellitus           (if abnormal result  is confirmed)  5.7-6.4%   Increased risk of developing  Diabetes Mellitus  References:Diagnosis and Classification of Diabetes Mellitus,Diabetes Care,2011,34(Suppl 1):S62-S69 and Standards  of Medical Care in         Diabetes - 2011,Diabetes [id number]  (Suppl 1):S11-S61.      ASSESSMENT AND PLAN:  1. Coronary artery disease involving native coronary artery of native heart without angina pectoris:  Doing well. s/p DES to the LCx and DES to the RCA in June 2016.  He has been on Plavix for many years.  He will need to remain on dual antiplatelet Rx.  He is not on beta-blocker due to bradycardia. Continue statin therapy.  2. Hypertension:  Excellent control.  3. Hyperlipidemia:  On crestor with excellent control    Medication Changes: Current medicines are reviewed at length with the patient today.  Concerns regarding medicines are as outlined above.  The following changes have been made:  none   Labs/ tests ordered today include:   CMET and lipids today.  Disposition:   FU with me 6 mos.    Signed, Richardine Peppers [place] MD, [facility]    02/04/2016 1:53 PM

## 2016-02-04 ENCOUNTER — Encounter: Payer: Self-pay | Admitting: Cardiology

## 2016-02-04 ENCOUNTER — Ambulatory Visit (INDEPENDENT_AMBULATORY_CARE_PROVIDER_SITE_OTHER): Payer: Medicare Other | Admitting: Cardiology

## 2016-02-04 VITALS — BP 134/62 | HR 69 | Ht 69.0 in | Wt 144.0 lb

## 2016-02-04 DIAGNOSIS — I10 Essential (primary) hypertension: Secondary | ICD-10-CM | POA: Diagnosis not present

## 2016-02-04 DIAGNOSIS — I2510 Atherosclerotic heart disease of native coronary artery without angina pectoris: Secondary | ICD-10-CM | POA: Diagnosis not present

## 2016-02-04 DIAGNOSIS — E7800 Pure hypercholesterolemia, unspecified: Secondary | ICD-10-CM | POA: Diagnosis not present

## 2016-02-04 NOTE — Patient Instructions (Signed)
Continue your current therapy  I will see you in 6 months.   

## 2016-06-17 ENCOUNTER — Other Ambulatory Visit: Payer: Self-pay | Admitting: Cardiology

## 2016-06-19 NOTE — Telephone Encounter (Signed)
Rx(s) sent to pharmacy electronically.  

## 2016-07-25 NOTE — Progress Notes (Signed)
Cardiology Office Note   Date:  07/27/2016   ID:  Kristopher Gonzalez, DOB 1940-07-03, MRN [id number]  PCP:   Dr. Tina Griffiths [facility], [place]) Cardiologist:  Dr. Lowell Makara [place]     Chief Complaint  Patient presents with  . Follow-up    6 months  . Coronary Artery Disease  . Hypertension     History of Present Illness: Kristopher Gonzalez is a 76 y.o. male with a hx of HTN, HL, CAD status post stenting of the RCA in 1999 with a BMS, Cypher DES to LCx in 2005 and repeat stenting to the mid RCA 07/2009 with a DES secondary to ISR. Myoview 09/2010 normal.  Admitted 6/20-6/22/16 with unstable angina. Cardiac cath revealed two-vessel coronary artery disease involving the mid Cx lesion, 95% in-stent restenosis. The proximal to mid RCA stents were patent. Dist RCA lesion, 80% stenosed. He underwent successful two-vessel intervention with 2.25 x 16 Synergy drug-eluting stent placed in the distal RCA and 3.0 x 12 Synergy drug-eluting stent placed in the mid circumflex. Normal LVEDP. Dual antiplatelet therapy recommended for minimum of 1 year with ASA and plavix.   On follow up today he is doing well. Denies any chest pain, dyspnea, or palpitations.   He denies orthopnea, PND or edema. He denies syncope. He is the primary caretaker of his wife.   Past Medical History:  Diagnosis Date  . Asthma   . Cerebral aneurysm   . Coronary artery disease   . Hyperlipidemia   . Hypertension   . MI, old     Past Surgical History:  Procedure Laterality Date  . CARDIAC CATHETERIZATION  07/16/2009   EF 65%  . CARDIAC CATHETERIZATION  10/12/2006   EF 50-60%  . CARDIAC CATHETERIZATION N/A 07/28/2014   Procedure: Left Heart Cath and Coronary Angiography;  Surgeon: Jettie Booze, MD;  Location: [place] [facility];  Service: Cardiovascular;  Laterality: N/A;  . CARDIAC CATHETERIZATION N/A 07/28/2014   Procedure: Coronary Stent Intervention;  Surgeon: Jettie Booze, MD;  Location: [place] [facility];   Service: Cardiovascular;  Laterality: N/A;  . CARDIOVASCULAR STRESS TEST  07/02/2002   EF 50%  . CORONARY STENT PLACEMENT     RIGHT CORONARY IN 1999 USING 3.0 X 23MM AND 3.0 X 13MM DUET STENTS  . CORONARY STENT PLACEMENT  2005   LEFT CIRCUMFLEX CORONARY WITH 2.5 X 28MM CYPHER STENT  . CORONARY STENT PLACEMENT     Mid RCA in June of 2011 with DES for in-stent restenosis.  [name] HERNIA REPAIR    . LUMBAR DISC SURGERY     X2  . MASTECTOMY    . TRANSTHORACIC ECHOCARDIOGRAM  11/23/2008   EF 55%     Current Outpatient Prescriptions  Medication Sig Dispense Refill  . amLODipine (NORVASC) 5 MG tablet TAKE 1 TABLET BY MOUTH TWICE DAILY 180 tablet 2  . aspirin 81 MG tablet Take 81 mg by mouth daily.      . budesonide (PULMICORT) 180 MCG/ACT inhaler Inhale 1 puff into the lungs 2 (two) times daily.    . Budesonide-Formoterol Fumarate (SYMBICORT IN) Inhale 1 puff into the lungs 2 (two) times daily.     . clopidogrel (PLAVIX) 75 MG tablet TAKE ONE TABLET BY MOUTH ONCE DAILY WITH FOOD 30 tablet 6  . nitroGLYCERIN (NITROSTAT) 0.3 MG SL tablet Place 1 tablet (0.3 mg total) under the tongue every 5 (five) minutes as needed for chest pain. 25 tablet 12  . pantoprazole (PROTONIX) 40 MG tablet  TAKE ONE TABLET BY MOUTH ONCE DAILY 30 tablet 6  . rosuvastatin (CRESTOR) 20 MG tablet TAKE 1 TABLET (20 MG TOTAL) BY MOUTH DAILY. 90 tablet 3  . traMADol (ULTRAM) 50 MG tablet Take 100 mg by mouth 4 (four) times daily.       No current facility-administered medications for this visit.     Allergies:   Patient has no known allergies.    Social History:  The patient  reports that he quit smoking about 44 years ago. He has never used smokeless tobacco. He reports that he does not drink alcohol.   Family History:  The patient's family history includes Hypertension in his father and mother; Stroke in his father and mother.    ROS:   Please see the history of present illness.   Review of Systems    Hematologic/Lymphatic: Negative for bleeding problem.  All other systems reviewed and are negative.     PHYSICAL EXAM: VS:  BP 130/64   Pulse 60   Ht 5\' 10"  (1.778 m)   Wt 152 lb (68.9 kg)   BMI 21.81 kg/m     Wt Readings from Last 3 Encounters:  07/27/16 152 lb (68.9 kg)  02/04/16 144 lb (65.3 kg)  06/14/15 145 lb 4 oz (65.9 kg)     GEN: Well nourished, well developed, in no acute distress  HEENT: normal  Neck: no JVD, no masses Cardiac:  Normal S1/S2, RRR; no murmur ,  no rubs or gallops, no edema  Respiratory:  clear to auscultation bilaterally, no wheezing, rhonchi or rales. 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  Skin: warm and dry  Neuro:  CNs II-XII intact, Strength and sensation are intact Psych: Normal affect   EKG:  EKG is not ordered today.     Recent Labs: Lab Results  Component Value Date   WBC 7.9 07/29/2014   HGB 12.1 (L) 07/29/2014   HCT 35.4 (L) 07/29/2014   PLT 260 07/29/2014   GLUCOSE 87 06/14/2015   CHOL 138 06/14/2015   TRIG 58 06/14/2015   HDL 66 06/14/2015   LDLCALC 60 06/14/2015   ALT 16 06/14/2015   AST 21 06/14/2015   NA 142 06/14/2015   K 4.1 06/14/2015   CL 104 06/14/2015   CREATININE 1.14 06/14/2015   BUN 13 06/14/2015   CO2 30 06/14/2015   INR 1.09 07/28/2014   HGBA1C (H) 07/16/2009    6.1 (NOTE)                                                                       According to the ADA Clinical Practice Recommendations for 2011, when HbA1c is used as a screening test:   >=6.5%   Diagnostic of Diabetes Mellitus           (if abnormal result  is confirmed)  5.7-6.4%   Increased risk of developing Diabetes Mellitus  References:Diagnosis and Classification of Diabetes Mellitus,Diabetes EYCX,4481,85(UDJSH 1):S62-S69 and Standards of Medical Care in         Diabetes - 2011,Diabetes FWYO,3785,88  (Suppl 1):S11-S61.      ASSESSMENT AND PLAN:  1. Coronary artery disease  involving native coronary artery of native heart without  angina pectoris:  Doing well. s/p DES to the LCx and DES to the RCA in June 2016.  He has been on Plavix for many years.  He will need to remain on dual antiplatelet Rx.  He is not on beta-blocker due to bradycardia. Continue statin therapy.  2. Hypertension:  Excellent control.  3. Hyperlipidemia:  On crestor - due for follow up fasting lab work- will check today.    Medication Changes: Current medicines are reviewed at length with the patient today.  Concerns regarding medicines are as outlined above.  The following changes have been made:  none   Labs/ tests ordered today include:   CMET and lipids today.  Disposition:   FU with me 6 mos.    Signed, Cyprian Gongaware [place] MD, [facility]    07/27/2016 5:30 PM

## 2016-07-27 ENCOUNTER — Ambulatory Visit (INDEPENDENT_AMBULATORY_CARE_PROVIDER_SITE_OTHER): Payer: Medicare Other | Admitting: Cardiology

## 2016-07-27 ENCOUNTER — Encounter: Payer: Self-pay | Admitting: Cardiology

## 2016-07-27 VITALS — BP 130/64 | HR 60 | Ht 70.0 in | Wt 152.0 lb

## 2016-07-27 DIAGNOSIS — E7800 Pure hypercholesterolemia, unspecified: Secondary | ICD-10-CM

## 2016-07-27 DIAGNOSIS — I2510 Atherosclerotic heart disease of native coronary artery without angina pectoris: Secondary | ICD-10-CM

## 2016-07-27 DIAGNOSIS — I10 Essential (primary) hypertension: Secondary | ICD-10-CM

## 2016-07-27 MED ORDER — NITROGLYCERIN 0.3 MG SL SUBL: 0.3000 mg | SUBLINGUAL_TABLET | SUBLINGUAL | 12 refills | Status: DC | PRN

## 2016-07-27 NOTE — Patient Instructions (Signed)
Continue your current therapy  We will check lab work today  I will see you in 6 months   

## 2016-07-28 LAB — LIPID PANEL
Chol/HDL Ratio: 2.1 ratio (ref 0.0–5.0)
Cholesterol, Total: 133 mg/dL (ref 100–199)
HDL: 64 mg/dL (ref >39)
LDL Calculated: 58 mg/dL (ref 0–99)
Triglycerides: 56 mg/dL (ref 0–149)
VLDL Cholesterol Cal: 11 mg/dL (ref 5–40)

## 2016-07-28 LAB — BASIC METABOLIC PANEL
BUN/Creatinine Ratio: 13 (ref 10–24)
BUN: 16 mg/dL (ref 8–27)
CO2: 25 mmol/L (ref 20–29)
Calcium: 9.5 mg/dL (ref 8.6–10.2)
Chloride: 101 mmol/L (ref 96–106)
Creatinine, Ser: 1.23 mg/dL (ref 0.76–1.27)
GFR calc Af Amer: 65 mL/min/{1.73_m2} (ref >59)
GFR calc non Af Amer: 56 mL/min/{1.73_m2} — ABNORMAL LOW (ref >59)
Glucose: 87 mg/dL (ref 65–99)
Potassium: 4.8 mmol/L (ref 3.5–5.2)
Sodium: 142 mmol/L (ref 134–144)

## 2016-07-28 LAB — HEPATIC FUNCTION PANEL
ALT: 19 IU/L (ref 0–44)
AST: 22 IU/L (ref 0–40)
Albumin: 4.7 g/dL (ref 3.5–4.8)
Alkaline Phosphatase: 54 IU/L (ref 39–117)
Bilirubin Total: 0.3 mg/dL (ref 0.0–1.2)
Bilirubin, Direct: 0.12 mg/dL (ref 0.00–0.40)
Total Protein: 6.8 g/dL (ref 6.0–8.5)

## 2016-10-21 ENCOUNTER — Other Ambulatory Visit: Payer: Self-pay | Admitting: Cardiology

## 2016-11-08 DIAGNOSIS — M1712 Unilateral primary osteoarthritis, left knee: Secondary | ICD-10-CM | POA: Diagnosis not present

## 2016-11-08 DIAGNOSIS — M170 Bilateral primary osteoarthritis of knee: Secondary | ICD-10-CM | POA: Diagnosis not present

## 2016-11-08 DIAGNOSIS — M1711 Unilateral primary osteoarthritis, right knee: Secondary | ICD-10-CM | POA: Diagnosis not present

## 2016-11-15 DIAGNOSIS — M170 Bilateral primary osteoarthritis of knee: Secondary | ICD-10-CM | POA: Diagnosis not present

## 2016-11-22 DIAGNOSIS — M170 Bilateral primary osteoarthritis of knee: Secondary | ICD-10-CM | POA: Diagnosis not present

## 2016-12-14 DIAGNOSIS — E7849 Other hyperlipidemia: Secondary | ICD-10-CM | POA: Diagnosis not present

## 2016-12-14 DIAGNOSIS — K210 Gastro-esophageal reflux disease with esophagitis: Secondary | ICD-10-CM | POA: Diagnosis not present

## 2016-12-14 DIAGNOSIS — I240 Acute coronary thrombosis not resulting in myocardial infarction: Secondary | ICD-10-CM | POA: Diagnosis not present

## 2016-12-14 DIAGNOSIS — I10 Essential (primary) hypertension: Secondary | ICD-10-CM | POA: Diagnosis not present

## 2017-01-02 DIAGNOSIS — H353131 Nonexudative age-related macular degeneration, bilateral, early dry stage: Secondary | ICD-10-CM | POA: Diagnosis not present

## 2017-01-02 DIAGNOSIS — H2513 Age-related nuclear cataract, bilateral: Secondary | ICD-10-CM | POA: Diagnosis not present

## 2017-02-21 ENCOUNTER — Telehealth: Payer: Self-pay

## 2017-02-21 NOTE — Telephone Encounter (Signed)
Spoke to patient he stated Crestor too expensive.Dr.Jordan advised when finished with Crestor start Atorvastatin 40 mg daily.Stated he will call for prescription when he finishes Crestor, he just had refilled.

## 2017-02-28 NOTE — Progress Notes (Signed)
Cardiology Office Note   Date:  03/05/2017   ID:  Kristopher Gonzalez, DOB 1940-10-17, MRN [id number]  PCP:   Dr. Tina Griffiths [facility], [place]) Cardiologist:  Dr. Peter [place]     Chief Complaint  Patient presents with  . Follow-up    6 months  . Shortness of Breath  . Chest Pain     History of Present Illness: Kristopher Gonzalez is a 77 y.o. male with a hx of HTN, HL, CAD status post stenting of the RCA in 1999 with a BMS, Cypher DES to LCx in 2005 and repeat stenting to the mid RCA 07/2009 with a DES secondary to ISR. Myoview 09/2010 normal.  Admitted 6/20-6/22/16 with unstable angina. Cardiac cath revealed two-vessel coronary artery disease involving the mid Cx lesion, 95% in-stent restenosis. The proximal to mid RCA stents were patent. Dist RCA lesion, 80% stenosed. He underwent successful two-vessel intervention with 2.25 x 16 Synergy drug-eluting stent placed in the distal RCA and 3.0 x 12 Synergy drug-eluting stent placed in the mid circumflex. Normal LVEDP. Dual antiplatelet therapy recommended for minimum of 1 year with ASA and plavix.   On follow up today he reports 2 episodes of chest pain. One week ago he had some chest pressure that lasted about 1 hour. Today he was getting his debilitated wife in the car with some difficulty and developed left precordial chest pain lasting about 45 minues. He did not take Ntg. He states it is not as bad as prior angina and his prior angina was more in the central midsternal area. No SOB or palpitations.  He denies orthopnea, PND or edema. He denies syncope. He is the primary caretaker of his wife.   Past Medical History:  Diagnosis Date  . Asthma   . Cerebral aneurysm   . Coronary artery disease   . Hyperlipidemia   . Hypertension   . MI, old     Past Surgical History:  Procedure Laterality Date  . CARDIAC CATHETERIZATION  07/16/2009   EF 65%  . CARDIAC CATHETERIZATION  10/12/2006   EF 50-60%  . CARDIAC CATHETERIZATION N/A 07/28/2014   Procedure: Left Heart Cath and Coronary Angiography;  Surgeon: Jettie Booze, MD;  Location: [place] [facility];  Service: Cardiovascular;  Laterality: N/A;  . CARDIAC CATHETERIZATION N/A 07/28/2014   Procedure: Coronary Stent Intervention;  Surgeon: Jettie Booze, MD;  Location: [place] [facility];  Service: Cardiovascular;  Laterality: N/A;  . CARDIOVASCULAR STRESS TEST  07/02/2002   EF 50%  . CORONARY STENT PLACEMENT     RIGHT CORONARY IN 1999 USING 3.0 X 23MM AND 3.0 X 13MM DUET STENTS  . CORONARY STENT PLACEMENT  2005   LEFT CIRCUMFLEX CORONARY WITH 2.5 X 28MM CYPHER STENT  . CORONARY STENT PLACEMENT     Mid RCA in June of 2011 with DES for in-stent restenosis.  [name] HERNIA REPAIR    . LUMBAR DISC SURGERY     X2  . MASTECTOMY    . TRANSTHORACIC ECHOCARDIOGRAM  11/23/2008   EF 55%     Current Outpatient Medications  Medication Sig Dispense Refill  . amLODipine (NORVASC) 5 MG tablet TAKE 1 TABLET BY MOUTH TWICE DAILY 180 tablet 2  . aspirin 81 MG tablet Take 81 mg by mouth daily.      [name] atorvastatin (LIPITOR) 40 MG tablet Take 1 tablet (40 mg total) by mouth daily. 30 tablet 6  . budesonide (PULMICORT) 180 MCG/ACT inhaler Inhale 1 puff into the lungs  2 (two) times daily.    . Budesonide-Formoterol Fumarate (SYMBICORT IN) Inhale 1 puff into the lungs 2 (two) times daily.     . clopidogrel (PLAVIX) 75 MG tablet TAKE ONE TABLET BY MOUTH ONCE DAILY WITH FOOD 30 tablet 6  . pantoprazole (PROTONIX) 40 MG tablet TAKE ONE TABLET BY MOUTH ONCE DAILY 30 tablet 6  . traMADol (ULTRAM) 50 MG tablet Take 100 mg by mouth 4 (four) times daily.      . nitroGLYCERIN (NITROSTAT) 0.3 MG SL tablet Place 1 tablet (0.3 mg total) under the tongue every 5 (five) minutes as needed for chest pain. 25 tablet 11   No current facility-administered medications for this visit.     Allergies:   Patient has no known allergies.    Social History:  The patient  reports that he quit smoking about 45 years  ago. he has never used smokeless tobacco. He reports that he does not drink alcohol.   Family History:  The patient's family history includes Hypertension in his father and mother; Stroke in his father and mother.    ROS:   Please see the history of present illness.   Review of Systems  Hematologic/Lymphatic: Negative for bleeding problem.  All other systems reviewed and are negative.     PHYSICAL EXAM: VS:  BP 140/68   Pulse 71   Ht 5\' 10"  (1.778 m)   Wt 149 lb (67.6 kg)   BMI 21.38 kg/m     Wt Readings from Last 3 Encounters:  03/05/17 149 lb (67.6 kg)  07/27/16 152 lb (68.9 kg)  02/04/16 144 lb (65.3 kg)     GEN: Well nourished, well developed, in no acute distress  HEENT: normal  Neck: no JVD, no masses Cardiac:  Normal S1/S2, RRR; no murmur ,  no rubs or gallops, no edema  Respiratory:  clear to auscultation bilaterally, no wheezing, rhonchi or rales. 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  Skin: warm and dry  Neuro:  CNs II-XII intact, Strength and sensation are intact Psych: Normal affect   EKG:  EKG is  ordered today. NSR with sinus arrhythmia. Rate 71. Otherwise normal.    Recent Labs: Lab Results  Component Value Date   WBC 7.9 07/29/2014   HGB 12.1 (L) 07/29/2014   HCT 35.4 (L) 07/29/2014   PLT 260 07/29/2014   GLUCOSE 87 07/27/2016   CHOL 133 07/27/2016   TRIG 56 07/27/2016   HDL 64 07/27/2016   LDLCALC 58 07/27/2016   ALT 19 07/27/2016   AST 22 07/27/2016   NA 142 07/27/2016   K 4.8 07/27/2016   CL 101 07/27/2016   CREATININE 1.23 07/27/2016   BUN 16 07/27/2016   CO2 25 07/27/2016   INR 1.09 07/28/2014   HGBA1C (H) 07/16/2009    6.1 (NOTE)                                                                       According to the ADA Clinical Practice Recommendations for 2011, when HbA1c is used as a screening test:   >=6.5%   Diagnostic of Diabetes Mellitus           (if abnormal  result  is confirmed)  5.7-6.4%   Increased risk of developing Diabetes Mellitus  References:Diagnosis and Classification of Diabetes Mellitus,Diabetes HQPR,9163,84(YKZLD 1):S62-S69 and Standards of Medical Care in         Diabetes - 2011,Diabetes JTTS,1779,39  (Suppl 1):S11-S61.      ASSESSMENT AND PLAN:  1. Coronary artery disease involving native coronary artery of native heart. 2 recent episodes of chest pain somewhat different from prior angina.    s/p DES to the LCx and DES to the RCA in June 2016.  He has been on Plavix for many years.  He will need to remain on dual antiplatelet Rx.  He is not on beta-blocker due to bradycardia. Continue statin therapy. Ecg is unchanged today. Will arrange for a Lexiscan myoview study.  2. Hypertension:  Excellent control.  3. Hyperlipidemia:  On crestor - last lipids looked very good.    Medication Changes: Current medicines are reviewed at length with the patient today.  Concerns regarding medicines are as outlined above.  The following changes have been made:  none   Labs/ tests ordered today include:   CMET and lipids today.  Disposition:   FU with me 6 mos.    Signed, Peter [place] MD, [facility]    03/05/2017 5:06 PM

## 2017-03-05 ENCOUNTER — Ambulatory Visit (INDEPENDENT_AMBULATORY_CARE_PROVIDER_SITE_OTHER): Payer: Medicare Other | Admitting: Cardiology

## 2017-03-05 ENCOUNTER — Encounter: Payer: Self-pay | Admitting: Cardiology

## 2017-03-05 ENCOUNTER — Other Ambulatory Visit: Payer: Self-pay

## 2017-03-05 VITALS — BP 140/68 | HR 71 | Ht 70.0 in | Wt 149.0 lb

## 2017-03-05 DIAGNOSIS — I209 Angina pectoris, unspecified: Secondary | ICD-10-CM

## 2017-03-05 DIAGNOSIS — R072 Precordial pain: Secondary | ICD-10-CM | POA: Diagnosis not present

## 2017-03-05 DIAGNOSIS — I25118 Atherosclerotic heart disease of native coronary artery with other forms of angina pectoris: Secondary | ICD-10-CM

## 2017-03-05 DIAGNOSIS — I10 Essential (primary) hypertension: Secondary | ICD-10-CM

## 2017-03-05 DIAGNOSIS — E7800 Pure hypercholesterolemia, unspecified: Secondary | ICD-10-CM

## 2017-03-05 MED ORDER — NITROGLYCERIN 0.3 MG SL SUBL: 0.3000 mg | SUBLINGUAL_TABLET | SUBLINGUAL | 11 refills | Status: DC | PRN

## 2017-03-05 NOTE — Patient Instructions (Signed)
We will schedule you for nuclear stress test  Continue your current therapy

## 2017-03-06 DIAGNOSIS — Z125 Encounter for screening for malignant neoplasm of prostate: Secondary | ICD-10-CM | POA: Diagnosis not present

## 2017-03-06 DIAGNOSIS — I25118 Atherosclerotic heart disease of native coronary artery with other forms of angina pectoris: Secondary | ICD-10-CM | POA: Diagnosis not present

## 2017-03-06 DIAGNOSIS — Z0000 Encounter for general adult medical examination without abnormal findings: Secondary | ICD-10-CM | POA: Diagnosis not present

## 2017-03-06 DIAGNOSIS — I240 Acute coronary thrombosis not resulting in myocardial infarction: Secondary | ICD-10-CM | POA: Diagnosis not present

## 2017-03-06 DIAGNOSIS — M545 Low back pain: Secondary | ICD-10-CM | POA: Diagnosis not present

## 2017-03-06 DIAGNOSIS — K210 Gastro-esophageal reflux disease with esophagitis: Secondary | ICD-10-CM | POA: Diagnosis not present

## 2017-03-06 DIAGNOSIS — I10 Essential (primary) hypertension: Secondary | ICD-10-CM | POA: Diagnosis not present

## 2017-03-06 DIAGNOSIS — R7303 Prediabetes: Secondary | ICD-10-CM | POA: Diagnosis not present

## 2017-03-06 DIAGNOSIS — M549 Dorsalgia, unspecified: Secondary | ICD-10-CM | POA: Diagnosis not present

## 2017-03-06 DIAGNOSIS — E7849 Other hyperlipidemia: Secondary | ICD-10-CM | POA: Diagnosis not present

## 2017-03-21 ENCOUNTER — Telehealth (HOSPITAL_COMMUNITY): Payer: Self-pay

## 2017-03-21 NOTE — Telephone Encounter (Signed)
Encounter complete. 

## 2017-03-23 ENCOUNTER — Ambulatory Visit (HOSPITAL_COMMUNITY)
Admission: RE | Admit: 2017-03-23 | Discharge: 2017-03-23 | Disposition: A | Discharge status: Home / Self Care | Payer: Medicare Other | Source: Ambulatory Visit | Attending: Cardiology | Admitting: Cardiology

## 2017-03-23 DIAGNOSIS — I10 Essential (primary) hypertension: Secondary | ICD-10-CM | POA: Diagnosis not present

## 2017-03-23 DIAGNOSIS — I25118 Atherosclerotic heart disease of native coronary artery with other forms of angina pectoris: Secondary | ICD-10-CM | POA: Diagnosis not present

## 2017-03-23 DIAGNOSIS — R072 Precordial pain: Secondary | ICD-10-CM | POA: Insufficient documentation

## 2017-03-23 DIAGNOSIS — E7800 Pure hypercholesterolemia, unspecified: Secondary | ICD-10-CM | POA: Insufficient documentation

## 2017-03-23 LAB — MYOCARDIAL PERFUSION IMAGING
LV dias vol: 84 mL (ref 62–150)
LV sys vol: 36 mL
Peak HR: 88 {beats}/min
Rest HR: 61 {beats}/min
SDS: 4
SRS: 2
SSS: 6
TID: 0.98

## 2017-03-23 IMAGING — NM NM MISC PROCEDURE
9 series · 54 of 54 positions shown · non-contrast
Comparison: none

[Series 1: wbr rest · 6.40mm/px · 6 of 64 frames shown]
[frame 6/64]
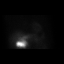
[frame 16/64]
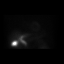
[frame 27/64]
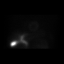
[frame 38/64]
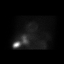
[frame 48/64]
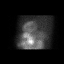
[frame 59/64]
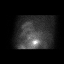

[Series 1: rest sax · 6.4mm · 6.40mm/px · 6 of 23 frames shown]
[frame 2/23]
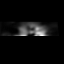
[frame 6/23]
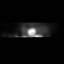
[frame 10/23]
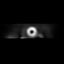
[frame 14/23]
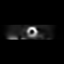
[frame 18/23]
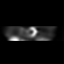
[frame 22/23]
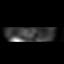

[Series 1: wbr_r-proj_st wbr rest · 6.40mm/px · 6 of 64 frames shown]
[frame 6/64]
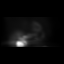
[frame 16/64]
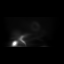
[frame 27/64]
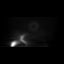
[frame 38/64]
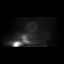
[frame 48/64]
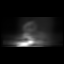
[frame 59/64]
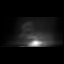

[Series 2: stress sax gs · 6.4mm · 6.40mm/px · 6 of 200 frames shown]
[frame 17/200]
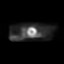
[frame 50/200]
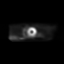
[frame 84/200]
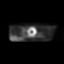
[frame 117/200]
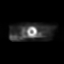
[frame 150/200]
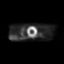
[frame 184/200]
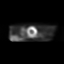

[Series 2: wbr stress-gsp · 6.40mm/px · 6 of 512 frames shown]
[frame 43/512]
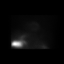
[frame 128/512]
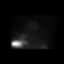
[frame 214/512]
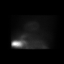
[frame 299/512]
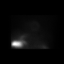
[frame 384/512]
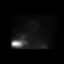
[frame 470/512]
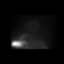

[Series 2: wbr_s-proj_st wbr stress-gsp · 6.40mm/px · 6 of 512 frames shown]
[frame 43/512]
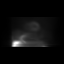
[frame 128/512]
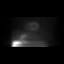
[frame 214/512]
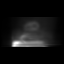
[frame 299/512]
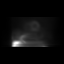
[frame 384/512]
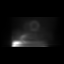
[frame 470/512]
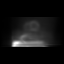

[Series 3: wbr_s-proj_st wbr stress-sum-em · 6.40mm/px · 6 of 64 frames shown]
[frame 6/64]
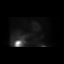
[frame 16/64]
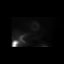
[frame 27/64]
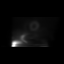
[frame 38/64]
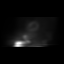
[frame 48/64]
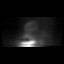
[frame 59/64]
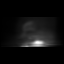

[Series 3: wbr stress-sum-em · 6.40mm/px · 6 of 64 frames shown]
[frame 6/64]
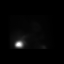
[frame 16/64]
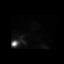
[frame 27/64]
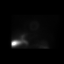
[frame 38/64]
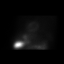
[frame 48/64]
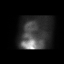
[frame 59/64]
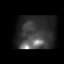

[Series 3: stress sax · 6.4mm · 6.40mm/px · 6 of 25 frames shown]
[frame 3/25]
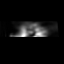
[frame 7/25]
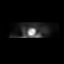
[frame 11/25]
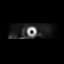
[frame 15/25]
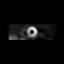
[frame 19/25]
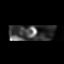
[frame 23/25]
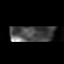

[54 of 54 positions shown; findings below may reference images not displayed]

Canned report from images found in remote index.

Refer to host system for actual result text.

## 2017-03-23 MED ORDER — REGADENOSON 0.4 MG/5ML IV SOLN
0.4000 mg | Freq: Once | INTRAVENOUS | Status: AC
  Administered 2017-03-23: 0.4 mg via INTRAVENOUS

## 2017-03-23 MED ORDER — AMINOPHYLLINE 25 MG/ML IV SOLN
75.0000 mg | Freq: Once | INTRAVENOUS | Status: AC
  Administered 2017-03-23: 75 mg via INTRAVENOUS

## 2017-03-23 MED ORDER — TECHNETIUM TC 99M TETROFOSMIN IV KIT
10.1000 | PACK | Freq: Once | INTRAVENOUS | Status: AC | PRN
  Administered 2017-03-23: 10.1 via INTRAVENOUS
  Filled 2017-03-23: qty 11

## 2017-03-23 MED ORDER — TECHNETIUM TC 99M TETROFOSMIN IV KIT
31.0000 | PACK | Freq: Once | INTRAVENOUS | Status: AC | PRN
  Administered 2017-03-23: 31 via INTRAVENOUS
  Filled 2017-03-23: qty 31

## 2017-04-21 ENCOUNTER — Other Ambulatory Visit: Payer: Self-pay | Admitting: Cardiology

## 2017-04-23 DIAGNOSIS — M8589 Other specified disorders of bone density and structure, multiple sites: Secondary | ICD-10-CM | POA: Diagnosis not present

## 2017-04-23 DIAGNOSIS — M810 Age-related osteoporosis without current pathological fracture: Secondary | ICD-10-CM | POA: Diagnosis not present

## 2017-04-23 DIAGNOSIS — M85851 Other specified disorders of bone density and structure, right thigh: Secondary | ICD-10-CM | POA: Diagnosis not present

## 2017-04-23 NOTE — Telephone Encounter (Signed)
REFILL 

## 2017-04-30 ENCOUNTER — Other Ambulatory Visit: Payer: Self-pay

## 2017-05-22 ENCOUNTER — Other Ambulatory Visit: Payer: Self-pay

## 2017-05-25 ENCOUNTER — Telehealth: Payer: Self-pay

## 2017-05-25 NOTE — Telephone Encounter (Signed)
Opened in error

## 2017-06-05 DIAGNOSIS — M545 Low back pain: Secondary | ICD-10-CM | POA: Diagnosis not present

## 2017-06-05 DIAGNOSIS — K210 Gastro-esophageal reflux disease with esophagitis: Secondary | ICD-10-CM | POA: Diagnosis not present

## 2017-06-05 DIAGNOSIS — I10 Essential (primary) hypertension: Secondary | ICD-10-CM | POA: Diagnosis not present

## 2017-06-05 DIAGNOSIS — E7849 Other hyperlipidemia: Secondary | ICD-10-CM | POA: Diagnosis not present

## 2017-06-05 DIAGNOSIS — I25118 Atherosclerotic heart disease of native coronary artery with other forms of angina pectoris: Secondary | ICD-10-CM | POA: Diagnosis not present

## 2017-09-12 DIAGNOSIS — I25118 Atherosclerotic heart disease of native coronary artery with other forms of angina pectoris: Secondary | ICD-10-CM | POA: Diagnosis not present

## 2017-09-12 DIAGNOSIS — M545 Low back pain: Secondary | ICD-10-CM | POA: Diagnosis not present

## 2017-09-12 DIAGNOSIS — K210 Gastro-esophageal reflux disease with esophagitis: Secondary | ICD-10-CM | POA: Diagnosis not present

## 2017-09-12 DIAGNOSIS — E7849 Other hyperlipidemia: Secondary | ICD-10-CM | POA: Diagnosis not present

## 2017-09-12 DIAGNOSIS — I10 Essential (primary) hypertension: Secondary | ICD-10-CM | POA: Diagnosis not present

## 2017-10-10 ENCOUNTER — Other Ambulatory Visit: Payer: Self-pay | Admitting: Cardiology

## 2017-11-23 ENCOUNTER — Encounter: Payer: Self-pay | Admitting: Cardiology

## 2017-12-10 DIAGNOSIS — M545 Low back pain: Secondary | ICD-10-CM | POA: Diagnosis not present

## 2017-12-10 DIAGNOSIS — I25118 Atherosclerotic heart disease of native coronary artery with other forms of angina pectoris: Secondary | ICD-10-CM | POA: Diagnosis not present

## 2017-12-10 DIAGNOSIS — K210 Gastro-esophageal reflux disease with esophagitis: Secondary | ICD-10-CM | POA: Diagnosis not present

## 2017-12-10 DIAGNOSIS — E7849 Other hyperlipidemia: Secondary | ICD-10-CM | POA: Diagnosis not present

## 2017-12-10 DIAGNOSIS — I10 Essential (primary) hypertension: Secondary | ICD-10-CM | POA: Diagnosis not present

## 2017-12-11 NOTE — Progress Notes (Deleted)
Cardiology Office Note   Date:  12/11/2017   ID:  Kristopher Gonzalez, DOB [birth year], MRN [id number]  PCP:   Dr. Tina Gonzalez [facility] : [facility], [place]) Cardiologist:  Dr. Jariel Gonzalez [place]     No chief complaint on file.    History of Present Illness: [name] is a 77 y.o. male with a hx of HTN, HL, CAD status post stenting of the RCA in 1999 with a BMS, Cypher DES to LCx in 2005 and repeat stenting to the mid RCA 07/2009 with a DES secondary to ISR. Myoview 09/2010 normal.  Admitted 6/20-6/22/16 with unstable angina. Cardiac cath revealed two-vessel coronary artery disease involving the mid Cx lesion, 95% in-stent restenosis. The proximal to mid RCA stents were patent. Dist RCA lesion, 80% stenosed. He underwent successful two-vessel intervention with 2.25 x 16 Synergy drug-eluting stent placed in the distal RCA and 3.0 x 12 Synergy drug-eluting stent placed in the mid circumflex. Normal LVEDP. Dual antiplatelet therapy recommended for minimum of 1 year with ASA and plavix.   On his last visit he did note some chest pain. Lexiscan myoview was ordered and was normal.   On follow up today he reports 2 episodes of chest pain. One week ago he had some chest pressure that lasted about 1 hour. Today he was getting his debilitated wife in the car with some difficulty and developed left precordial chest pain lasting about 45 minues. He did not take Ntg. He states it is not as bad as prior angina and his prior angina was more in the central midsternal area. No SOB or palpitations.  He denies orthopnea, PND or edema. He denies syncope. He is the primary caretaker of his wife.   Past Medical History:  Diagnosis Date  . Asthma   . Cerebral aneurysm   . Coronary artery disease   . Hyperlipidemia   . Hypertension   . MI, old     Past Surgical History:  Procedure Laterality Date  . CARDIAC CATHETERIZATION  07/16/2009   EF 65%  . CARDIAC CATHETERIZATION  10/12/2006   EF 50-60%  . CARDIAC CATHETERIZATION  N/A 07/28/2014   Procedure: Left Heart Cath and Coronary Angiography;  Surgeon: Kristopher Booze, MD;  Location: [place] [facility];  Service: Cardiovascular;  Laterality: N/A;  . CARDIAC CATHETERIZATION N/A 07/28/2014   Procedure: Coronary Stent Intervention;  Surgeon: Kristopher Booze, MD;  Location: [place] [facility];  Service: Cardiovascular;  Laterality: N/A;  . CARDIOVASCULAR STRESS TEST  07/02/2002   EF 50%  . CORONARY STENT PLACEMENT     RIGHT CORONARY IN 1999 USING 3.0 X 23MM AND 3.0 X 13MM DUET STENTS  . CORONARY STENT PLACEMENT  2005   LEFT CIRCUMFLEX CORONARY WITH 2.5 X 28MM CYPHER STENT  . CORONARY STENT PLACEMENT     Mid RCA in June of 2011 with DES for in-stent restenosis.  [name] HERNIA REPAIR    . LUMBAR DISC SURGERY     X2  . MASTECTOMY    . TRANSTHORACIC ECHOCARDIOGRAM  11/23/2008   EF 55%     Current Outpatient Medications  Medication Sig Dispense Refill  . amLODipine (NORVASC) 5 MG tablet TAKE 1 TABLET BY MOUTH TWICE DAILY 180 tablet 2  . aspirin 81 MG tablet Take 81 mg by mouth daily.      [name] atorvastatin (LIPITOR) 40 MG tablet Take 1 tablet (40 mg total) by mouth daily. 30 tablet 6  . budesonide (PULMICORT) 180 MCG/ACT inhaler Inhale 1 puff into the  lungs 2 (two) times daily.    . Budesonide-Formoterol Fumarate (SYMBICORT IN) Inhale 1 puff into the lungs 2 (two) times daily.     . clopidogrel (PLAVIX) 75 MG tablet TAKE ONE TABLET BY MOUTH ONCE DAILY WITH FOOD 30 tablet 6  . nitroGLYCERIN (NITROSTAT) 0.3 MG SL tablet Place 1 tablet (0.3 mg total) under the tongue every 5 (five) minutes as needed for chest pain. 25 tablet 11  . pantoprazole (PROTONIX) 40 MG tablet TAKE ONE TABLET BY MOUTH ONCE DAILY 30 tablet 6  . rosuvastatin (CRESTOR) 20 MG tablet TAKE 1 TABLET (20 MG TOTAL) BY MOUTH DAILY. 90 tablet 2  . traMADol (ULTRAM) 50 MG tablet Take 100 mg by mouth 4 (four) times daily.       No current facility-administered medications for this visit.     Allergies:    Patient has no known allergies.    Social History:  The patient  reports that he quit smoking about 45 years ago. He has never used smokeless tobacco. He reports that he does not drink alcohol or use drugs.   Family History:  The patient's family history includes Hypertension in his father and mother; Stroke in his father and mother.    ROS:   Please see the history of present illness.   Review of Systems  Hematologic/Lymphatic: Negative for bleeding problem.  All other systems reviewed and are negative.     PHYSICAL EXAM: VS:  There were no vitals taken for this visit.    Wt Readings from Last 3 Encounters:  03/23/17 149 lb (67.6 kg)  03/05/17 149 lb (67.6 kg)  07/27/16 152 lb (68.9 kg)     GEN: Well nourished, well developed, in no acute distress  HEENT: normal  Neck: no JVD, no masses Cardiac:  Normal S1/S2, RRR; no murmur ,  no rubs or gallops, no edema  Respiratory:  clear to auscultation bilaterally, no wheezing, rhonchi or rales. 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  Skin: warm and dry  Neuro:  CNs II-XII intact, Strength and sensation are intact Psych: Normal affect   EKG:  EKG is  ordered today. NSR with sinus arrhythmia. Rate 71. Otherwise normal.    Recent Labs: Lab Results  Component Value Date   WBC 7.9 07/29/2014   HGB 12.1 (L) 07/29/2014   HCT 35.4 (L) 07/29/2014   PLT 260 07/29/2014   GLUCOSE 87 07/27/2016   CHOL 133 07/27/2016   TRIG 56 07/27/2016   HDL 64 07/27/2016   LDLCALC 58 07/27/2016   ALT 19 07/27/2016   AST 22 07/27/2016   NA 142 07/27/2016   K 4.8 07/27/2016   CL 101 07/27/2016   CREATININE 1.23 07/27/2016   BUN 16 07/27/2016   CO2 25 07/27/2016   INR 1.09 07/28/2014   HGBA1C (H) 07/16/2009    6.1 (NOTE)                                                                       According to the ADA Clinical Practice Recommendations for 2011, when HbA1c is used as a  screening test:   >=6.5%   Diagnostic of Diabetes Mellitus           (  if abnormal result  is confirmed)  5.7-6.4%   Increased risk of developing Diabetes Mellitus  References:Diagnosis and Classification of Diabetes Mellitus,Diabetes TUUE,2800,34(JZPHX 1):S62-S69 and Standards of Medical Care in         Diabetes - 2011,Diabetes TAVW,9794,80  (Suppl 1):S11-S61.   Labs dated 03/06/17: cholesterol 138, triglycerides 101, HDL 53, LDL 58. A1c 5.7%.    Myoview 03/23/17: Study Highlights     The left ventricular ejection fraction is normal (55-65%).  Nuclear stress EF: 58%.  No T wave inversion was noted during stress.  There was no ST segment deviation noted during stress.  This is a low risk study.   Normal perfusion. LVEF 58% with normal wall motion. This is a low risk study.      ASSESSMENT AND PLAN:  1. Coronary artery disease involving native coronary artery of native heart.   s/p DES to the LCx and DES to the RCA in June 2016.  He has been on Plavix for many years.  He will need to remain on dual antiplatelet Rx.  He is not on beta-blocker due to bradycardia. Continue statin therapy. Leane Call was normal in February 2019.  2. Hypertension:  Excellent control.  3. Hyperlipidemia:  On crestor - last lipids looked very good.    Medication Changes: Current medicines are reviewed at length with the patient today.  Concerns regarding medicines are as outlined above.  The following changes have been made:  none   Labs/ tests ordered today include:   CMET and lipids today.  Disposition:   FU with me 6 mos.    Signed, Nigel Ericsson [place] MD, [facility]    12/11/2017 12:58 PM

## 2017-12-13 ENCOUNTER — Ambulatory Visit: Payer: Medicare Other | Admitting: Cardiology

## 2018-02-19 NOTE — Progress Notes (Deleted)
Cardiology Office Note   Date:  02/19/2018   ID:  Kristopher Gonzalez, DOB [birth year], MRN [id number]  PCP:   Dr. Tina Griffiths [facility], [place]) Cardiologist:  Dr. Vian Fluegel [place]     No chief complaint on file.    History of Present Illness: Kristopher Gonzalez is a 78 y.o. male seen for follow up CAD. He has a hx of HTN, HL, CAD status post stenting of the RCA in 1999 with a BMS, Cypher DES to LCx in 2005 and repeat stenting to the mid RCA 07/2009 with a DES secondary to ISR. Myoview 09/2010 normal.  Admitted 6/20-6/22/16 with unstable angina. Cardiac cath revealed two-vessel coronary artery disease involving the mid Cx lesion, 95% in-stent restenosis. The proximal to mid RCA stents were patent. Dist RCA lesion, 80% stenosed. He underwent successful two-vessel intervention with 2.25 x 16 Synergy drug-eluting stent placed in the distal RCA and 3.0 x 12 Synergy drug-eluting stent placed in the mid circumflex. Normal LVEDP. Dual antiplatelet therapy recommended for minimum of 1 year with ASA and plavix. Myoview study in Feb 2019 was normal.  On follow up today he reports 2 episodes of chest pain. One week ago he had some chest pressure that lasted about 1 hour. Today he was getting his debilitated wife in the car with some difficulty and developed left precordial chest pain lasting about 45 minues. He did not take Ntg. He states it is not as bad as prior angina and his prior angina was more in the central midsternal area. No SOB or palpitations.  He denies orthopnea, PND or edema. He denies syncope. He is the primary caretaker of his wife.   Past Medical History:  Diagnosis Date  . Asthma   . Cerebral aneurysm   . Coronary artery disease   . Hyperlipidemia   . Hypertension   . MI, old     Past Surgical History:  Procedure Laterality Date  . CARDIAC CATHETERIZATION  07/16/2009   EF 65%  . CARDIAC CATHETERIZATION  10/12/2006   EF 50-60%  . CARDIAC CATHETERIZATION N/A 07/28/2014   Procedure: Left  Heart Cath and Coronary Angiography;  Surgeon: Jettie Booze, MD;  Location: [place] [facility];  Service: Cardiovascular;  Laterality: N/A;  . CARDIAC CATHETERIZATION N/A 07/28/2014   Procedure: Coronary Stent Intervention;  Surgeon: Jettie Booze, MD;  Location: [place] [facility];  Service: Cardiovascular;  Laterality: N/A;  . CARDIOVASCULAR STRESS TEST  07/02/2002   EF 50%  . CORONARY STENT PLACEMENT     RIGHT CORONARY IN 1999 USING 3.0 X 23MM AND 3.0 X 13MM DUET STENTS  . CORONARY STENT PLACEMENT  2005   LEFT CIRCUMFLEX CORONARY WITH 2.5 X 28MM CYPHER STENT  . CORONARY STENT PLACEMENT     Mid RCA in June of 2011 with DES for in-stent restenosis.  [name] HERNIA REPAIR    . LUMBAR DISC SURGERY     X2  . MASTECTOMY    . TRANSTHORACIC ECHOCARDIOGRAM  11/23/2008   EF 55%     Current Outpatient Medications  Medication Sig Dispense Refill  . amLODipine (NORVASC) 5 MG tablet TAKE 1 TABLET BY MOUTH TWICE DAILY 180 tablet 2  . aspirin 81 MG tablet Take 81 mg by mouth daily.      [name] atorvastatin (LIPITOR) 40 MG tablet Take 1 tablet (40 mg total) by mouth daily. 30 tablet 6  . budesonide (PULMICORT) 180 MCG/ACT inhaler Inhale 1 puff into the lungs 2 (two) times daily.    [name]  Budesonide-Formoterol Fumarate (SYMBICORT IN) Inhale 1 puff into the lungs 2 (two) times daily.     . clopidogrel (PLAVIX) 75 MG tablet TAKE ONE TABLET BY MOUTH ONCE DAILY WITH FOOD 30 tablet 6  . nitroGLYCERIN (NITROSTAT) 0.3 MG SL tablet Place 1 tablet (0.3 mg total) under the tongue every 5 (five) minutes as needed for chest pain. 25 tablet 11  . pantoprazole (PROTONIX) 40 MG tablet TAKE ONE TABLET BY MOUTH ONCE DAILY 30 tablet 6  . rosuvastatin (CRESTOR) 20 MG tablet TAKE 1 TABLET (20 MG TOTAL) BY MOUTH DAILY. 90 tablet 2  . traMADol (ULTRAM) 50 MG tablet Take 100 mg by mouth 4 (four) times daily.       No current facility-administered medications for this visit.     Allergies:   Patient has no known allergies.     Social History:  The patient  reports that he quit smoking about 46 years ago. He has never used smokeless tobacco. He reports that he does not drink alcohol or use drugs.   Family History:  The patient's family history includes Hypertension in his father and mother; Stroke in his father and mother.    ROS:   Please see the history of present illness.   Review of Systems  Hematologic/Lymphatic: Negative for bleeding problem.  All other systems reviewed and are negative.     PHYSICAL EXAM: VS:  There were no vitals taken for this visit.    Wt Readings from Last 3 Encounters:  03/23/17 149 lb (67.6 kg)  03/05/17 149 lb (67.6 kg)  07/27/16 152 lb (68.9 kg)     GEN: Well nourished, well developed, in no acute distress  HEENT: normal  Neck: no JVD, no masses Cardiac:  Normal S1/S2, RRR; no murmur ,  no rubs or gallops, no edema  Respiratory:  clear to auscultation bilaterally, no wheezing, rhonchi or rales. 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  Skin: warm and dry  Neuro:  CNs II-XII intact, Strength and sensation are intact Psych: Normal affect   EKG:  EKG is  ordered today. NSR with sinus arrhythmia. Rate 71. Otherwise normal.    Recent Labs: Lab Results  Component Value Date   WBC 7.9 07/29/2014   HGB 12.1 (L) 07/29/2014   HCT 35.4 (L) 07/29/2014   PLT 260 07/29/2014   GLUCOSE 87 07/27/2016   CHOL 133 07/27/2016   TRIG 56 07/27/2016   HDL 64 07/27/2016   LDLCALC 58 07/27/2016   ALT 19 07/27/2016   AST 22 07/27/2016   NA 142 07/27/2016   K 4.8 07/27/2016   CL 101 07/27/2016   CREATININE 1.23 07/27/2016   BUN 16 07/27/2016   CO2 25 07/27/2016   INR 1.09 07/28/2014   HGBA1C (H) 07/16/2009    6.1 (NOTE)                                                                       According to the ADA Clinical Practice Recommendations for 2011, when HbA1c is used as a screening test:   >=6.5%   Diagnostic  of Diabetes Mellitus           (if abnormal result  is confirmed)  5.7-6.4%   Increased risk of developing Diabetes Mellitus  References:Diagnosis and Classification of Diabetes Mellitus,Diabetes UMPN,3614,43(XVQMG 1):S62-S69 and Standards of Medical Care in         Diabetes - 2011,Diabetes QQPY,1950,93  (Suppl 1):S11-S61.   Dated 03/06/17: cholesterol 138, triglycerides 101, HDL 53. A1c 5.7%. Dated 12/10/17: creatinine 1.03.  Myoview 03/23/17: Study Highlights     The left ventricular ejection fraction is normal (55-65%).  Nuclear stress EF: 58%.  No T wave inversion was noted during stress.  There was no ST segment deviation noted during stress.  This is a low risk study.   Normal perfusion. LVEF 58% with normal wall motion. This is a low risk study.     ASSESSMENT AND PLAN:  1. Coronary artery disease involving native coronary artery of native heart.    s/p DES to the LCx and DES to the RCA in June 2016.  He has been on Plavix for many years.  He will need to remain on dual antiplatelet Rx.  He is not on beta-blocker due to bradycardia. Continue statin therapy. Myoview study in Feb 2019 was normal.   2. Hypertension:  Excellent control.  3. Hyperlipidemia:  On crestor - last lipids looked very good.    Medication Changes: Current medicines are reviewed at length with the patient today.  Concerns regarding medicines are as outlined above.  The following changes have been made:  none   Labs/ tests ordered today include:   CMET and lipids today.  Disposition:   FU with me 6 mos.    Signed, Shavana Calder [place] MD, [facility]    02/19/2018 10:27 AM

## 2018-02-21 ENCOUNTER — Ambulatory Visit: Payer: Medicare Other | Admitting: Cardiology

## 2018-02-22 ENCOUNTER — Other Ambulatory Visit: Payer: Self-pay | Admitting: Cardiology

## 2018-03-18 DIAGNOSIS — E7849 Other hyperlipidemia: Secondary | ICD-10-CM | POA: Diagnosis not present

## 2018-03-18 DIAGNOSIS — Z1389 Encounter for screening for other disorder: Secondary | ICD-10-CM | POA: Diagnosis not present

## 2018-03-18 DIAGNOSIS — M545 Low back pain: Secondary | ICD-10-CM | POA: Diagnosis not present

## 2018-03-18 DIAGNOSIS — Z0000 Encounter for general adult medical examination without abnormal findings: Secondary | ICD-10-CM | POA: Diagnosis not present

## 2018-03-18 DIAGNOSIS — K210 Gastro-esophageal reflux disease with esophagitis: Secondary | ICD-10-CM | POA: Diagnosis not present

## 2018-03-18 DIAGNOSIS — I10 Essential (primary) hypertension: Secondary | ICD-10-CM | POA: Diagnosis not present

## 2018-03-18 DIAGNOSIS — I25118 Atherosclerotic heart disease of native coronary artery with other forms of angina pectoris: Secondary | ICD-10-CM | POA: Diagnosis not present

## 2018-05-02 ENCOUNTER — Telehealth: Payer: Self-pay

## 2018-05-02 NOTE — Telephone Encounter (Addendum)
   Primary Cardiologist:  Peter Martinique, MD   Patient contacted.  History reviewed.  No symptoms to suggest any unstable cardiac conditions.  Based on discussion, with current pandemic situation, we will be postponing this 05/08/18  appointment for Kristopher Gonzalez with a plan for f/u in 4 to 6 wks or sooner if feasible/necessary.  If symptoms change, he has been instructed to contact our office.   Routing to C19 CANCEL pool for tracking 4 to 6 weeks.  Tami Lin [place], [place]  05/13/2018 1:40 PM         .

## 2018-05-08 ENCOUNTER — Ambulatory Visit: Payer: Medicare Other | Admitting: Cardiology

## 2018-05-13 ENCOUNTER — Telehealth: Payer: Self-pay | Admitting: Physician Assistant

## 2018-05-13 NOTE — Telephone Encounter (Addendum)
Please disregard previous message to schedule a VIDEO visit. I spoke with Mr and Mrs Mahoney. They both prefer to wait and see Dr. Martinique during an in-patient visit. Which may be Aug or later. I asked them to call back if they had any new symptoms. The expressd understanding. I will remove from C19 box.

## 2018-05-20 NOTE — Telephone Encounter (Signed)
Spoke to patient follow up appointment scheduled with Dr.Jordan 10/17/18 at 2:00 pm.Advised to call sooner if needed.

## 2018-06-08 ENCOUNTER — Other Ambulatory Visit: Payer: Self-pay | Admitting: Cardiology

## 2018-06-08 NOTE — Telephone Encounter (Signed)
Amlodipine 5 mg refilled. 

## 2018-06-09 ENCOUNTER — Other Ambulatory Visit: Payer: Self-pay | Admitting: Cardiology

## 2018-06-20 DIAGNOSIS — K210 Gastro-esophageal reflux disease with esophagitis: Secondary | ICD-10-CM | POA: Diagnosis not present

## 2018-06-20 DIAGNOSIS — I25118 Atherosclerotic heart disease of native coronary artery with other forms of angina pectoris: Secondary | ICD-10-CM | POA: Diagnosis not present

## 2018-06-20 DIAGNOSIS — M545 Low back pain: Secondary | ICD-10-CM | POA: Diagnosis not present

## 2018-06-20 DIAGNOSIS — I10 Essential (primary) hypertension: Secondary | ICD-10-CM | POA: Diagnosis not present

## 2018-06-20 DIAGNOSIS — E7849 Other hyperlipidemia: Secondary | ICD-10-CM | POA: Diagnosis not present

## 2018-07-28 ENCOUNTER — Other Ambulatory Visit: Payer: Self-pay | Admitting: Cardiology

## 2018-08-27 ENCOUNTER — Ambulatory Visit: Payer: Medicare Other | Admitting: Cardiology

## 2018-08-27 ENCOUNTER — Other Ambulatory Visit: Payer: Self-pay

## 2018-08-27 MED ORDER — AMLODIPINE BESYLATE 10 MG PO TABS: 10.0000 mg | ORAL_TABLET | Freq: Every day | ORAL | 0 refills | Status: DC

## 2018-08-27 NOTE — Telephone Encounter (Signed)
Rx(s) sent to pharmacy electronically.  

## 2018-09-20 ENCOUNTER — Other Ambulatory Visit: Payer: Self-pay | Admitting: Cardiology

## 2018-10-01 DIAGNOSIS — M545 Low back pain: Secondary | ICD-10-CM | POA: Diagnosis not present

## 2018-10-01 DIAGNOSIS — I10 Essential (primary) hypertension: Secondary | ICD-10-CM | POA: Diagnosis not present

## 2018-10-01 DIAGNOSIS — K210 Gastro-esophageal reflux disease with esophagitis: Secondary | ICD-10-CM | POA: Diagnosis not present

## 2018-10-01 DIAGNOSIS — I25118 Atherosclerotic heart disease of native coronary artery with other forms of angina pectoris: Secondary | ICD-10-CM | POA: Diagnosis not present

## 2018-10-01 DIAGNOSIS — E7849 Other hyperlipidemia: Secondary | ICD-10-CM | POA: Diagnosis not present

## 2018-10-11 NOTE — Progress Notes (Deleted)
Cardiology Office Note   Date:  10/11/2018   ID:  Kristopher Gonzalez, DOB 09-24-1940, MRN [id number]  PCP:   Dr. Tina Griffiths [facility], [place]) Cardiologist:  Dr.  Martinique     No chief complaint on file.    History of Present Illness: Kristopher Gonzalez is a 78 y.o. male with a hx of HTN, HL, CAD status post stenting of the RCA in 1999 with a BMS, Cypher DES to LCx in 2005 and repeat stenting to the mid RCA 07/2009 with a DES secondary to ISR. Myoview 09/2010 normal.  Admitted 6/20-6/22/16 with unstable angina. Cardiac cath revealed two-vessel coronary artery disease involving the mid Cx lesion, 95% in-stent restenosis. The proximal to mid RCA stents were patent. Dist RCA lesion, 80% stenosed. He underwent successful two-vessel intervention with 2.25 x 16 Synergy drug-eluting stent placed in the distal RCA and 3.0 x 12 Synergy drug-eluting stent placed in the mid circumflex. Normal LVEDP. Dual antiplatelet therapy recommended for minimum of 1 year with ASA and plavix.   On follow up today he reports 2 episodes of chest pain. One week ago he had some chest pressure that lasted about 1 hour. Today he was getting his debilitated wife in the car with some difficulty and developed left precordial chest pain lasting about 45 minues. He did not take Ntg. He states it is not as bad as prior angina and his prior angina was more in the central midsternal area. No SOB or palpitations.  He denies orthopnea, PND or edema. He denies syncope. He is the primary caretaker of his wife.   Past Medical History:  Diagnosis Date  . Asthma   . Cerebral aneurysm   . Coronary artery disease   . Hyperlipidemia   . Hypertension   . MI, old     Past Surgical History:  Procedure Laterality Date  . CARDIAC CATHETERIZATION  07/16/2009   EF 65%  . CARDIAC CATHETERIZATION  10/12/2006   EF 50-60%  . CARDIAC CATHETERIZATION N/A 07/28/2014   Procedure: Left Heart Cath and Coronary Angiography;  Surgeon: Jettie Booze, MD;  Location: [place] [facility];  Service: Cardiovascular;  Laterality: N/A;  . CARDIAC CATHETERIZATION N/A 07/28/2014   Procedure: Coronary Stent Intervention;  Surgeon: Jettie Booze, MD;  Location: [place] [facility];  Service: Cardiovascular;  Laterality: N/A;  . CARDIOVASCULAR STRESS TEST  07/02/2002   EF 50%  . CORONARY STENT PLACEMENT     RIGHT CORONARY IN 1999 USING 3.0 X 23MM AND 3.0 X 13MM DUET STENTS  . CORONARY STENT PLACEMENT  2005   LEFT CIRCUMFLEX CORONARY WITH 2.5 X 28MM CYPHER STENT  . CORONARY STENT PLACEMENT     Mid RCA in June of 2011 with DES for in-stent restenosis.  [name] HERNIA REPAIR    . LUMBAR DISC SURGERY     X2  . MASTECTOMY    . TRANSTHORACIC ECHOCARDIOGRAM  11/23/2008   EF 55%     Current Outpatient Medications  Medication Sig Dispense Refill  . amLODipine (NORVASC) 10 MG tablet Take 1 tablet (10 mg total) by mouth daily. MUST KEEP APPOINTMENT 10/17/18 WITH DR Martinique FOR FUTURE REFILLS 90 tablet 0  . aspirin 81 MG tablet Take 81 mg by mouth daily.      [name] atorvastatin (LIPITOR) 40 MG tablet Take 1 tablet (40 mg total) by mouth daily. 30 tablet 6  . budesonide (PULMICORT) 180 MCG/ACT inhaler Inhale 1 puff into the lungs 2 (two) times daily.    [name]  Budesonide-Formoterol Fumarate (SYMBICORT IN) Inhale 1 puff into the lungs 2 (two) times daily.     . clopidogrel (PLAVIX) 75 MG tablet TAKE ONE TABLET BY MOUTH ONCE DAILY WITH FOOD 30 tablet 6  . nitroGLYCERIN (NITROSTAT) 0.3 MG SL tablet Place 1 tablet (0.3 mg total) under the tongue every 5 (five) minutes as needed for chest pain. 25 tablet 11  . pantoprazole (PROTONIX) 40 MG tablet TAKE ONE TABLET BY MOUTH ONCE DAILY 30 tablet 6  . rosuvastatin (CRESTOR) 20 MG tablet Take 1 tablet (20 mg total) by mouth daily. KEEP OV. 90 tablet 0  . traMADol (ULTRAM) 50 MG tablet Take 100 mg by mouth 4 (four) times daily.       No current facility-administered medications for this visit.     Allergies:   Patient  has no known allergies.    Social History:  The patient  reports that he quit smoking about 46 years ago. He has never used smokeless tobacco. He reports that he does not drink alcohol or use drugs.   Family History:  The patient's family history includes Hypertension in his father and mother; Stroke in his father and mother.    ROS:   Please see the history of present illness.   Review of Systems  Hematologic/Lymphatic: Negative for bleeding problem.  All other systems reviewed and are negative.     PHYSICAL EXAM: VS:  There were no vitals taken for this visit.    Wt Readings from Last 3 Encounters:  03/23/17 149 lb (67.6 kg)  03/05/17 149 lb (67.6 kg)  07/27/16 152 lb (68.9 kg)     GEN: Well nourished, well developed, in no acute distress  HEENT: normal  Neck: no JVD, no masses Cardiac:  Normal S1/S2, RRR; no murmur ,  no rubs or gallops, no edema  Respiratory:  clear to auscultation bilaterally, no wheezing, rhonchi or rales. 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  Skin: warm and dry  Neuro:  CNs II-XII intact, Strength and sensation are intact Psych: Normal affect   EKG:  EKG is  ordered today. NSR with sinus arrhythmia. Rate 71. Otherwise normal.    Recent Labs: Lab Results  Component Value Date   WBC 7.9 07/29/2014   HGB 12.1 (L) 07/29/2014   HCT 35.4 (L) 07/29/2014   PLT 260 07/29/2014   GLUCOSE 87 07/27/2016   CHOL 133 07/27/2016   TRIG 56 07/27/2016   HDL 64 07/27/2016   LDLCALC 58 07/27/2016   ALT 19 07/27/2016   AST 22 07/27/2016   NA 142 07/27/2016   K 4.8 07/27/2016   CL 101 07/27/2016   CREATININE 1.23 07/27/2016   BUN 16 07/27/2016   CO2 25 07/27/2016   INR 1.09 07/28/2014   HGBA1C (H) 07/16/2009    6.1 (NOTE)                                                                       According to the ADA Clinical Practice Recommendations for 2011, when HbA1c is used as a screening  test:   >=6.5%   Diagnostic of Diabetes Mellitus           (if abnormal result  is confirmed)  5.7-6.4%   Increased risk of developing Diabetes Mellitus  References:Diagnosis and Classification of Diabetes Mellitus,Diabetes [id number] 1):S62-S69 and Standards of Medical Care in         Diabetes - 2011,Diabetes [id number]  (Suppl 1):S11-S61.      ASSESSMENT AND PLAN:  1. Coronary artery disease involving native coronary artery of native heart. 2 recent episodes of chest pain somewhat different from prior angina.    s/p DES to the LCx and DES to the RCA in June 2016.  He has been on Plavix for many years.  He will need to remain on dual antiplatelet Rx.  He is not on beta-blocker due to bradycardia. Continue statin therapy. Ecg is unchanged today. Will arrange for a Lexiscan myoview study.  2. Hypertension:  Excellent control.  3. Hyperlipidemia:  On crestor - last lipids looked very good.    Medication Changes: Current medicines are reviewed at length with the patient today.  Concerns regarding medicines are as outlined above.  The following changes have been made:  none   Labs/ tests ordered today include:   CMET and lipids today.  Disposition:   FU with me 6 mos.    Signed,  Martin[place] MD, [facility]    10/11/2018 1:26 PM

## 2018-10-17 ENCOUNTER — Ambulatory Visit: Payer: Medicare Other | Admitting: Cardiology

## 2018-11-19 ENCOUNTER — Other Ambulatory Visit: Payer: Self-pay | Admitting: Cardiology

## 2018-12-12 ENCOUNTER — Other Ambulatory Visit: Payer: Self-pay | Admitting: Cardiology

## 2018-12-12 NOTE — Telephone Encounter (Signed)
Pt overdue for 6 month f/u.  Please contact pt for future appointment. 

## 2018-12-13 NOTE — Progress Notes (Signed)
Cardiology Office Note   Date:  12/18/2018   ID:  Kristopher Gonzalez, DOB 02-20-1940, MRN [id number]  PCP:   Kristopher Gonzalez Cardiologist:  Dr. Azim Gillingham Gonzalez     Chief Complaint  Patient presents with  . Coronary Artery Disease     History of Present Illness: Kristopher Gonzalez is a 78 y.o. male with a hx of HTN, HL, CAD status post stenting of the RCA in 1999 with a BMS, Cypher DES to LCx in 2005 and repeat stenting to the mid RCA 07/2009 with a DES secondary to ISR. Myoview 09/2010 normal.  Admitted 6/20-6/22/16 with unstable angina. Cardiac cath revealed two-vessel coronary artery disease involving the mid Cx lesion, 95% in-stent restenosis. The proximal to mid RCA stents were patent. Dist RCA lesion, 80% stenosed. He underwent successful two-vessel intervention with 2.25 x 16 Synergy drug-eluting stent placed in the distal RCA and 3.0 x 12 Synergy drug-eluting stent placed in the mid circumflex. Normal LVEDP. Dual antiplatelet therapy recommended for minimum of 1 year with ASA and plavix. Myoview study in Feb 2019 was normal.   On follow up today he is doing very well. Denies any chest pain, SOB, palpitations, dizziness. Wife recently place in [place] after hospitalization. ? TIA/.     Past Medical History:  Diagnosis Date  . Asthma   . Cerebral aneurysm   . Coronary artery disease   . Hyperlipidemia   . Hypertension   . MI, old     Past Surgical History:  Procedure Laterality Date  . CARDIAC CATHETERIZATION  07/16/2009   EF 65%  . CARDIAC CATHETERIZATION  10/12/2006   EF 50-60%  . CARDIAC CATHETERIZATION N/A 07/28/2014   Procedure: Left Heart Cath and Coronary Angiography;  Surgeon: Kristopher Booze, MD;  Location: [facility];  Service: Cardiovascular;  Laterality: N/A;  . CARDIAC CATHETERIZATION N/A 07/28/2014   Procedure: Coronary Stent Intervention;  Surgeon: Kristopher Booze, MD;  Location: [facility];  Service: Cardiovascular;  Laterality: N/A;  .  CARDIOVASCULAR STRESS TEST  07/02/2002   EF 50%  . CORONARY STENT PLACEMENT     RIGHT CORONARY IN 1999 USING 3.0 X 23MM AND 3.0 X 13MM DUET STENTS  . CORONARY STENT PLACEMENT  2005   LEFT CIRCUMFLEX CORONARY WITH 2.5 X 28MM CYPHER STENT  . CORONARY STENT PLACEMENT     Mid RCA in June of 2011 with DES for in-stent restenosis.  [name] HERNIA REPAIR    . LUMBAR DISC SURGERY     X2  . MASTECTOMY    . TRANSTHORACIC ECHOCARDIOGRAM  11/23/2008   EF 55%     Current Outpatient Medications  Medication Sig Dispense Refill  . amLODipine (NORVASC) 10 MG tablet TAKE 1 TABLET (10 MG TOTAL) BY MOUTH DAILY. MUST KEEP APPOINTMENT 10/17/18 30 tablet 0  . aspirin 81 MG tablet Take 81 mg by mouth daily.      [name] atorvastatin (LIPITOR) 40 MG tablet Take 1 tablet (40 mg total) by mouth daily. 30 tablet 6  . budesonide (PULMICORT) 180 MCG/ACT inhaler Inhale 1 puff into the lungs 2 (two) times daily.    . Budesonide-Formoterol Fumarate (SYMBICORT IN) Inhale 1 puff into the lungs 2 (two) times daily.     . clopidogrel (PLAVIX) 75 MG tablet TAKE ONE TABLET BY MOUTH ONCE DAILY WITH FOOD 30 tablet 6  . nitroGLYCERIN (NITROSTAT) 0.3 MG SL tablet Place 1 tablet (0.3 mg total) under the tongue every 5 (five) minutes as needed for  chest pain. 25 tablet 11  . pantoprazole (PROTONIX) 40 MG tablet TAKE ONE TABLET BY MOUTH ONCE DAILY 30 tablet 6  . rosuvastatin (CRESTOR) 20 MG tablet Take 1 tablet (20 mg total) by mouth daily. PT OVERDUE FOR DR Gonzalez OV PLEASE CALL FOR APPT #2 30 tablet 0  . traMADol (ULTRAM) 50 MG tablet Take 100 mg by mouth 4 (four) times daily.       No current facility-administered medications for this visit.     Allergies:   Patient has no known allergies.    Social History:  The patient  reports that he quit smoking about 46 years ago. He has never used smokeless tobacco. He reports that he does not drink alcohol or use drugs.   Family History:  The patient's family history includes Hypertension in  his father and mother; Stroke in his father and mother.    ROS:   Please see the history of present illness.   Review of Systems  Hematologic/Lymphatic: Negative for bleeding problem.  All other systems reviewed and are negative.     PHYSICAL EXAM: VS:  BP 119/70   Pulse 75   Temp (!) 97.3 F (36.3 C)   Ht 5\' 9"  (1.753 m)   Wt 142 lb 3.2 oz (64.5 kg)   SpO2 97%   BMI 21.00 kg/m     Wt Readings from Last 3 Encounters:  12/18/18 142 lb 3.2 oz (64.5 kg)  03/23/17 149 lb (67.6 kg)  03/05/17 149 lb (67.6 kg)     GEN: Well nourished, well developed, in no acute distress  HEENT: normal  Neck: no JVD, no masses Cardiac:  Normal S1/S2, RRR; no murmur ,  no rubs or gallops, no edema  Respiratory:  clear to auscultation bilaterally, no wheezing, rhonchi or rales. 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  Skin: warm and dry  Neuro:  CNs II-XII intact, Strength and sensation are intact Psych: Normal affect   EKG:  EKG is  ordered today. NSR with sinus arrhythmia. Rate 65. Otherwise normal. I have personally reviewed and interpreted this study.     Recent Labs: Lab Results  Component Value Date   WBC 7.9 07/29/2014   HGB 12.1 (L) 07/29/2014   HCT 35.4 (L) 07/29/2014   PLT 260 07/29/2014   GLUCOSE 87 07/27/2016   CHOL 133 07/27/2016   TRIG 56 07/27/2016   HDL 64 07/27/2016   LDLCALC 58 07/27/2016   ALT 19 07/27/2016   AST 22 07/27/2016   NA 142 07/27/2016   K 4.8 07/27/2016   CL 101 07/27/2016   CREATININE 1.23 07/27/2016   BUN 16 07/27/2016   CO2 25 07/27/2016   INR 1.09 07/28/2014   HGBA1C (H) 07/16/2009    6.1 (NOTE)                                                                       According to the ADA Clinical Practice Recommendations for 2011, when HbA1c is used as a screening test:   >=6.5%   Diagnostic of Diabetes Mellitus           (if abnormal result  is confirmed)  5.7-6.4%   Increased risk of  developing Diabetes Mellitus  References:Diagnosis and Classification of Diabetes Mellitus,Diabetes [id number] 1):S62-S69 and Standards of Medical Care in         Diabetes - 2011,Diabetes [id number]  (Suppl 1):S11-S61.    Dated 10/01/18: cholesterol 140, triglycerides 66, HDL 70, LDL 57. Creatinine 1.08. otherwise CMET normal   Myoview 03/23/17: Study Highlights    The left ventricular ejection fraction is normal (55-65%).  Nuclear stress EF: 58%.  No T wave inversion was noted during stress.  There was no ST segment deviation noted during stress.  This is a low risk study.   Normal perfusion. LVEF 58% with normal wall motion. This is a low risk study.      ASSESSMENT AND PLAN:  1. Coronary artery disease involving native coronary artery of native heart.  s/p DES to the LCx and DES to the RCA in June 2016.  Myoview study in Feb 2019 was normal. He is asymptomatic. Will continue medical therapy  2. Hypertension:  Excellent control.  3. Hyperlipidemia:  On crestor - last lipids looked very good.    Medication Changes: Current medicines are reviewed at length with the patient today.  Concerns regarding medicines are as outlined above.  The following changes have been made:  none   Labs/ tests ordered today include:   CMET and lipids today.  Disposition:   FU with me one year   Signed, Kian Gamarra Martinique MD, [facility]    12/18/2018 3:27 PM

## 2018-12-18 ENCOUNTER — Other Ambulatory Visit: Payer: Self-pay

## 2018-12-18 ENCOUNTER — Telehealth: Payer: Self-pay | Admitting: Cardiology

## 2018-12-18 ENCOUNTER — Ambulatory Visit (INDEPENDENT_AMBULATORY_CARE_PROVIDER_SITE_OTHER): Payer: Medicare Other | Admitting: Cardiology

## 2018-12-18 ENCOUNTER — Encounter: Payer: Self-pay | Admitting: Cardiology

## 2018-12-18 VITALS — BP 119/70 | HR 75 | Temp 97.3°F | Ht 69.0 in | Wt 142.2 lb

## 2018-12-18 DIAGNOSIS — I25118 Atherosclerotic heart disease of native coronary artery with other forms of angina pectoris: Secondary | ICD-10-CM

## 2018-12-18 DIAGNOSIS — I10 Essential (primary) hypertension: Secondary | ICD-10-CM

## 2018-12-18 DIAGNOSIS — E7800 Pure hypercholesterolemia, unspecified: Secondary | ICD-10-CM | POA: Diagnosis not present

## 2018-12-18 NOTE — Telephone Encounter (Signed)
Patient states that all of his prescriptions need to be 90 day refills when being sent to his pharmacy or otherwise insurance will not pay. States he does not have any medications to refill now but when the time does come to refill them, they need to be 90 day.

## 2018-12-18 NOTE — Telephone Encounter (Signed)
Noted  

## 2019-01-01 ENCOUNTER — Other Ambulatory Visit: Payer: Self-pay

## 2019-01-01 DIAGNOSIS — R10819 Abdominal tenderness, unspecified site: Secondary | ICD-10-CM | POA: Diagnosis not present

## 2019-01-01 DIAGNOSIS — L570 Actinic keratosis: Secondary | ICD-10-CM | POA: Diagnosis not present

## 2019-01-01 DIAGNOSIS — K219 Gastro-esophageal reflux disease without esophagitis: Secondary | ICD-10-CM | POA: Diagnosis not present

## 2019-01-01 DIAGNOSIS — D485 Neoplasm of uncertain behavior of skin: Secondary | ICD-10-CM | POA: Diagnosis not present

## 2019-01-01 DIAGNOSIS — I25118 Atherosclerotic heart disease of native coronary artery with other forms of angina pectoris: Secondary | ICD-10-CM | POA: Diagnosis not present

## 2019-01-01 DIAGNOSIS — E7849 Other hyperlipidemia: Secondary | ICD-10-CM | POA: Diagnosis not present

## 2019-01-01 DIAGNOSIS — I10 Essential (primary) hypertension: Secondary | ICD-10-CM | POA: Diagnosis not present

## 2019-01-01 DIAGNOSIS — C4492 Squamous cell carcinoma of skin, unspecified: Secondary | ICD-10-CM | POA: Diagnosis not present

## 2019-01-01 DIAGNOSIS — M545 Low back pain: Secondary | ICD-10-CM | POA: Diagnosis not present

## 2019-01-05 ENCOUNTER — Other Ambulatory Visit: Payer: Self-pay | Admitting: Cardiology

## 2019-01-07 ENCOUNTER — Other Ambulatory Visit: Payer: Self-pay | Admitting: Cardiology

## 2019-01-13 DIAGNOSIS — K838 Other specified diseases of biliary tract: Secondary | ICD-10-CM | POA: Diagnosis not present

## 2019-01-13 DIAGNOSIS — R109 Unspecified abdominal pain: Secondary | ICD-10-CM | POA: Diagnosis not present

## 2019-01-13 DIAGNOSIS — R10819 Abdominal tenderness, unspecified site: Secondary | ICD-10-CM | POA: Diagnosis not present

## 2019-02-01 ENCOUNTER — Other Ambulatory Visit: Payer: Self-pay | Admitting: Cardiology

## 2019-04-14 DIAGNOSIS — I10 Essential (primary) hypertension: Secondary | ICD-10-CM | POA: Diagnosis not present

## 2019-04-14 DIAGNOSIS — M545 Low back pain: Secondary | ICD-10-CM | POA: Diagnosis not present

## 2019-04-14 DIAGNOSIS — K219 Gastro-esophageal reflux disease without esophagitis: Secondary | ICD-10-CM | POA: Diagnosis not present

## 2019-04-14 DIAGNOSIS — E7849 Other hyperlipidemia: Secondary | ICD-10-CM | POA: Diagnosis not present

## 2019-04-14 DIAGNOSIS — Z0000 Encounter for general adult medical examination without abnormal findings: Secondary | ICD-10-CM | POA: Diagnosis not present

## 2019-04-14 DIAGNOSIS — Z1331 Encounter for screening for depression: Secondary | ICD-10-CM | POA: Diagnosis not present

## 2019-04-14 DIAGNOSIS — I25118 Atherosclerotic heart disease of native coronary artery with other forms of angina pectoris: Secondary | ICD-10-CM | POA: Diagnosis not present

## 2019-07-01 DIAGNOSIS — G43911 Migraine, unspecified, intractable, with status migrainosus: Secondary | ICD-10-CM | POA: Diagnosis not present

## 2019-07-01 DIAGNOSIS — R1934 Left lower quadrant abdominal rigidity: Secondary | ICD-10-CM | POA: Diagnosis not present

## 2019-07-02 DIAGNOSIS — R109 Unspecified abdominal pain: Secondary | ICD-10-CM | POA: Diagnosis not present

## 2019-07-02 DIAGNOSIS — K5900 Constipation, unspecified: Secondary | ICD-10-CM | POA: Diagnosis not present

## 2019-07-15 DIAGNOSIS — K219 Gastro-esophageal reflux disease without esophagitis: Secondary | ICD-10-CM | POA: Diagnosis not present

## 2019-07-15 DIAGNOSIS — G43911 Migraine, unspecified, intractable, with status migrainosus: Secondary | ICD-10-CM | POA: Diagnosis not present

## 2019-07-15 DIAGNOSIS — I10 Essential (primary) hypertension: Secondary | ICD-10-CM | POA: Diagnosis not present

## 2019-07-15 DIAGNOSIS — H8110 Benign paroxysmal vertigo, unspecified ear: Secondary | ICD-10-CM | POA: Diagnosis not present

## 2019-10-15 DIAGNOSIS — K219 Gastro-esophageal reflux disease without esophagitis: Secondary | ICD-10-CM | POA: Diagnosis not present

## 2019-10-15 DIAGNOSIS — G4700 Insomnia, unspecified: Secondary | ICD-10-CM | POA: Diagnosis not present

## 2019-10-15 DIAGNOSIS — I10 Essential (primary) hypertension: Secondary | ICD-10-CM | POA: Diagnosis not present

## 2019-10-15 DIAGNOSIS — H8110 Benign paroxysmal vertigo, unspecified ear: Secondary | ICD-10-CM | POA: Diagnosis not present

## 2019-10-15 DIAGNOSIS — G43911 Migraine, unspecified, intractable, with status migrainosus: Secondary | ICD-10-CM | POA: Diagnosis not present

## 2019-10-17 DIAGNOSIS — K219 Gastro-esophageal reflux disease without esophagitis: Secondary | ICD-10-CM | POA: Diagnosis not present

## 2019-10-17 DIAGNOSIS — I10 Essential (primary) hypertension: Secondary | ICD-10-CM | POA: Diagnosis not present

## 2019-10-17 DIAGNOSIS — G4700 Insomnia, unspecified: Secondary | ICD-10-CM | POA: Diagnosis not present

## 2019-10-17 DIAGNOSIS — H8110 Benign paroxysmal vertigo, unspecified ear: Secondary | ICD-10-CM | POA: Diagnosis not present

## 2019-10-17 DIAGNOSIS — G43911 Migraine, unspecified, intractable, with status migrainosus: Secondary | ICD-10-CM | POA: Diagnosis not present

## 2019-12-01 DIAGNOSIS — G43911 Migraine, unspecified, intractable, with status migrainosus: Secondary | ICD-10-CM | POA: Diagnosis not present

## 2019-12-01 DIAGNOSIS — M545 Low back pain, unspecified: Secondary | ICD-10-CM | POA: Diagnosis not present

## 2019-12-11 ENCOUNTER — Telehealth: Payer: Self-pay | Admitting: Cardiology

## 2019-12-11 NOTE — Telephone Encounter (Signed)
New message:     Patient calling to speak with some one concering his wife.please call patient.

## 2020-01-14 DIAGNOSIS — I728 Aneurysm of other specified arteries: Secondary | ICD-10-CM | POA: Diagnosis not present

## 2020-01-14 DIAGNOSIS — M545 Low back pain, unspecified: Secondary | ICD-10-CM | POA: Diagnosis not present

## 2020-01-14 DIAGNOSIS — I10 Essential (primary) hypertension: Secondary | ICD-10-CM | POA: Diagnosis not present

## 2020-01-19 DIAGNOSIS — Z9289 Personal history of other medical treatment: Secondary | ICD-10-CM | POA: Diagnosis not present

## 2020-01-19 DIAGNOSIS — J323 Chronic sphenoidal sinusitis: Secondary | ICD-10-CM | POA: Diagnosis not present

## 2020-01-19 DIAGNOSIS — Z9889 Other specified postprocedural states: Secondary | ICD-10-CM | POA: Diagnosis not present

## 2020-01-19 DIAGNOSIS — I671 Cerebral aneurysm, nonruptured: Secondary | ICD-10-CM | POA: Diagnosis not present

## 2020-01-19 DIAGNOSIS — R519 Headache, unspecified: Secondary | ICD-10-CM | POA: Diagnosis not present

## 2020-02-16 NOTE — H&P (View-Only) (Signed)
Cardiology Office Note   Date:  02/20/2020   ID:  Kristopher Gonzalez, DOB Oct 26, 1940, MRN [id number]  PCP:   Dr. Stoney Bang Cardiologist:  Dr. Xavious Sharrar Martin[place]     Chief Complaint  Patient presents with  . Chest Pain  . Coronary Artery Disease     History of Present Illness: Kristopher Gonzalez is a 80 y.o. male with a hx of HTN, HL, CAD status post stenting of the RCA in 1999 with a BMS, Cypher DES to LCx in 2005 and repeat stenting to the mid RCA 07/2009 with a DES secondary to ISR. Myoview 09/2010 normal.  Admitted 6/20-6/22/16 with unstable angina. Cardiac cath revealed two-vessel coronary artery disease involving the mid Cx lesion, 95% in-stent restenosis. The proximal to mid RCA stents were patent. Dist RCA lesion, 80% stenosed. He underwent successful two-vessel intervention with 2.25 x 16 Synergy drug-eluting stent placed in the distal RCA and 3.0 x 12 Synergy drug-eluting stent placed in the mid circumflex. Normal LVEDP. Dual antiplatelet therapy recommended for minimum of 1 year with ASA and plavix. Myoview study in Feb 2019 was normal.   On follow up today he reports recurrent angina over the past 2 months. Symptoms occur with exertion such as working in the yard or carrying groceries. He has not taken sl Ntg but has curtailed his activity to avoid chest pain. Describes this as anterior chest pressure like prior angina.     Past Medical History:  Diagnosis Date  . Asthma   . Cerebral aneurysm   . Coronary artery disease   . Hyperlipidemia   . Hypertension   . MI, old     Past Surgical History:  Procedure Laterality Date  . CARDIAC CATHETERIZATION  07/16/2009   EF 65%  . CARDIAC CATHETERIZATION  10/12/2006   EF 50-60%  . CARDIAC CATHETERIZATION N/A 07/28/2014   Procedure: Left Heart Cath and Coronary Angiography;  Surgeon: Jettie Booze, MD;  Location: [facility];  Service: Cardiovascular;  Laterality: N/A;  . CARDIAC CATHETERIZATION N/A 07/28/2014    Procedure: Coronary Stent Intervention;  Surgeon: Jettie Booze, MD;  Location: [facility];  Service: Cardiovascular;  Laterality: N/A;  . CARDIOVASCULAR STRESS TEST  07/02/2002   EF 50%  . CORONARY STENT PLACEMENT     RIGHT CORONARY IN 1999 USING 3.0 X 23MM AND 3.0 X 13MM DUET STENTS  . CORONARY STENT PLACEMENT  2005   LEFT CIRCUMFLEX CORONARY WITH 2.5 X 28MM CYPHER STENT  . CORONARY STENT PLACEMENT     Mid RCA in June of 2011 with DES for in-stent restenosis.  [name] HERNIA REPAIR    . LUMBAR DISC SURGERY     X2  . MASTECTOMY    . TRANSTHORACIC ECHOCARDIOGRAM  11/23/2008   EF 55%     Current Outpatient Medications  Medication Sig Dispense Refill  . amLODipine (NORVASC) 10 MG tablet Take 1 tablet (10 mg total) by mouth daily. 90 tablet 3  . aspirin 81 MG tablet Take 81 mg by mouth daily.    [name] atorvastatin (LIPITOR) 40 MG tablet Take 1 tablet (40 mg total) by mouth daily. 30 tablet 6  . budesonide (PULMICORT) 180 MCG/ACT inhaler Inhale 1 puff into the lungs 2 (two) times daily.    . Budesonide-Formoterol Fumarate (SYMBICORT IN) Inhale 1 puff into the lungs 2 (two) times daily.    . clopidogrel (PLAVIX) 75 MG tablet TAKE ONE TABLET BY MOUTH ONCE DAILY WITH FOOD 30 tablet 6  .  pantoprazole (PROTONIX) 40 MG tablet TAKE ONE TABLET BY MOUTH ONCE DAILY 30 tablet 6  . rosuvastatin (CRESTOR) 20 MG tablet Take 1 tablet (20 mg total) by mouth daily. 90 tablet 3  . topiramate (TOPAMAX) 25 MG tablet Take 25 mg by mouth 2 (two) times daily.    . traMADol (ULTRAM) 50 MG tablet Take 100 mg by mouth 4 (four) times daily.    . nitroGLYCERIN (NITROSTAT) 0.3 MG SL tablet Place 1 tablet (0.3 mg total) under the tongue every 5 (five) minutes as needed for chest pain. 25 tablet 11   No current facility-administered medications for this visit.    Allergies:   Patient has no known allergies.    Social History:  The patient  reports that he quit smoking about 48 years ago. He has never used  smokeless tobacco. He reports that he does not drink alcohol and does not use drugs.   Family History:  The patient's family history includes Hypertension in his father and mother; Stroke in his father and mother.    ROS:   Please see the history of present illness.   Review of Systems  Hematologic/Lymphatic: Negative for bleeding problem.  All other systems reviewed and are negative.     PHYSICAL EXAM: VS:  BP 140/72   Pulse 76   Ht 5' 9.5" (1.765 m)   Wt 154 lb (69.9 kg)   BMI 22.42 kg/m     Wt Readings from Last 3 Encounters:  02/20/20 154 lb (69.9 kg)  12/18/18 142 lb 3.2 oz (64.5 kg)  03/23/17 149 lb (67.6 kg)     GEN: Well nourished, well developed, in no acute distress  HEENT: normal  Neck: no JVD, no masses Cardiac:  Normal S1/S2, RRR; no murmur ,  no rubs or gallops, no edema  Respiratory:  clear to auscultation bilaterally, no wheezing, rhonchi or rales. 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  Skin: warm and dry  Neuro:  CNs II-XII intact, Strength and sensation are intact Psych: Normal affect   EKG:  EKG is  ordered today. NSR with rate 76.  Normal. I have personally reviewed and interpreted this study.     Recent Labs: Lab Results  Component Value Date   WBC 7.9 07/29/2014   HGB 12.1 (L) 07/29/2014   HCT 35.4 (L) 07/29/2014   PLT 260 07/29/2014   GLUCOSE 87 07/27/2016   CHOL 133 07/27/2016   TRIG 56 07/27/2016   HDL 64 07/27/2016   LDLCALC 58 07/27/2016   ALT 19 07/27/2016   AST 22 07/27/2016   NA 142 07/27/2016   K 4.8 07/27/2016   CL 101 07/27/2016   CREATININE 1.23 07/27/2016   BUN 16 07/27/2016   CO2 25 07/27/2016   INR 1.09 07/28/2014   HGBA1C (H) 07/16/2009    6.1 (NOTE)                                                                       According to the ADA Clinical Practice Recommendations for 2011, when HbA1c is used as a screening test:   >=6.5%   Diagnostic of  Diabetes Mellitus           (  if abnormal result  is confirmed)  5.7-6.4%   Increased risk of developing Diabetes Mellitus  References:Diagnosis and Classification of Diabetes Mellitus,Diabetes [id number] 1):S62-S69 and Standards of Medical Care in         Diabetes - 2011,Diabetes [id number]  (Suppl 1):S11-S61.    Dated 10/01/18: cholesterol 140, triglycerides 66, HDL 70, LDL 57. Creatinine 1.08. otherwise CMET normal Dated 10/17/19: cholesterol 134, triglycerides 72, HDL 53, LDL 57. Creatinine 1.25. otherwise CMET normal.   Myoview 03/23/17: Study Highlights    The left ventricular ejection fraction is normal (55-65%).  Nuclear stress EF: 58%.  No T wave inversion was noted during stress.  There was no ST segment deviation noted during stress.  This is a low risk study.   Normal perfusion. LVEF 58% with normal wall motion. This is a low risk study.      ASSESSMENT AND PLAN:  1. Coronary artery disease involving native coronary artery of native heart.  Multiple stents in the past. Last cath with p DES to the LCx and DES to the RCA in June 2016.  Myoview study in Feb 2019 was normal. He has recurrent class 2-3 angina. We discussed increasing his medication versus repeat cardiac cath. He would favor proceeding with cardiac cath. Will update labs and schedule for next week. The procedure and risks were reviewed including but not limited to death, myocardial infarction, stroke, arrythmias, bleeding, transfusion, emergency surgery, dye allergy, or renal dysfunction. The patient voices understanding and is agreeable to proceed.   2. Hypertension:  Excellent control.  3. Hyperlipidemia:  On crestor - last lipids looked very good.    Medication Changes: Current medicines are reviewed at length with the patient today.  Concerns regarding medicines are as outlined above.  The following changes have been made:  none   Labs/ tests ordered today include:   Labs today. Covid  test Cardiac cath next week.   Signed, Domenique Quest Martinique MD, [facility]    02/20/2020 3:32 PM

## 2020-02-16 NOTE — Progress Notes (Signed)
Cardiology Office Note   Date:  02/20/2020   ID:  [name], DOB Oct 26, 1940, MRN [id number]  PCP:   Dr. Stoney Bang Cardiologist:  Dr. Aleayah Chico Martinique     Chief Complaint  Patient presents with  . Chest Pain  . Coronary Artery Disease     History of Present Illness: Jaylun Fleener is a 80 y.o. male with a hx of HTN, HL, CAD status post stenting of the RCA in 1999 with a BMS, Cypher DES to LCx in 2005 and repeat stenting to the mid RCA 07/2009 with a DES secondary to ISR. Myoview 09/2010 normal.  Admitted 6/20-6/22/16 with unstable angina. Cardiac cath revealed two-vessel coronary artery disease involving the mid Cx lesion, 95% in-stent restenosis. The proximal to mid RCA stents were patent. Dist RCA lesion, 80% stenosed. He underwent successful two-vessel intervention with 2.25 x 16 Synergy drug-eluting stent placed in the distal RCA and 3.0 x 12 Synergy drug-eluting stent placed in the mid circumflex. Normal LVEDP. Dual antiplatelet therapy recommended for minimum of 1 year with ASA and plavix. Myoview study in Feb 2019 was normal.   On follow up today he reports recurrent angina over the past 2 months. Symptoms occur with exertion such as working in the yard or carrying groceries. He has not taken sl Ntg but has curtailed his activity to avoid chest pain. Describes this as anterior chest pressure like prior angina.     Past Medical History:  Diagnosis Date  . Asthma   . Cerebral aneurysm   . Coronary artery disease   . Hyperlipidemia   . Hypertension   . MI, old     Past Surgical History:  Procedure Laterality Date  . CARDIAC CATHETERIZATION  07/16/2009   EF 65%  . CARDIAC CATHETERIZATION  10/12/2006   EF 50-60%  . CARDIAC CATHETERIZATION N/A 07/28/2014   Procedure: Left Heart Cath and Coronary Angiography;  Surgeon: Jettie Booze, MD;  Location: [facility] [place] [facility];  Service: Cardiovascular;  Laterality: N/A;  . CARDIAC CATHETERIZATION N/A 07/28/2014    Procedure: Coronary Stent Intervention;  Surgeon: Jettie Booze, MD;  Location: [facility];  Service: Cardiovascular;  Laterality: N/A;  . CARDIOVASCULAR STRESS TEST  07/02/2002   EF 50%  . CORONARY STENT PLACEMENT     RIGHT CORONARY IN 1999 USING 3.0 X 23MM AND 3.0 X 13MM DUET STENTS  . CORONARY STENT PLACEMENT  2005   LEFT CIRCUMFLEX CORONARY WITH 2.5 X 28MM CYPHER STENT  . CORONARY STENT PLACEMENT     Mid RCA in June of 2011 with DES for in-stent restenosis.  [name] HERNIA REPAIR    . LUMBAR DISC SURGERY     X2  . MASTECTOMY    . TRANSTHORACIC ECHOCARDIOGRAM  11/23/2008   EF 55%     Current Outpatient Medications  Medication Sig Dispense Refill  . amLODipine (NORVASC) 10 MG tablet Take 1 tablet (10 mg total) by mouth daily. 90 tablet 3  . aspirin 81 MG tablet Take 81 mg by mouth daily.    [name] atorvastatin (LIPITOR) 40 MG tablet Take 1 tablet (40 mg total) by mouth daily. 30 tablet 6  . budesonide (PULMICORT) 180 MCG/ACT inhaler Inhale 1 puff into the lungs 2 (two) times daily.    . Budesonide-Formoterol Fumarate (SYMBICORT IN) Inhale 1 puff into the lungs 2 (two) times daily.    . clopidogrel (PLAVIX) 75 MG tablet TAKE ONE TABLET BY MOUTH ONCE DAILY WITH FOOD 30 tablet 6  .  pantoprazole (PROTONIX) 40 MG tablet TAKE ONE TABLET BY MOUTH ONCE DAILY 30 tablet 6  . rosuvastatin (CRESTOR) 20 MG tablet Take 1 tablet (20 mg total) by mouth daily. 90 tablet 3  . topiramate (TOPAMAX) 25 MG tablet Take 25 mg by mouth 2 (two) times daily.    . traMADol (ULTRAM) 50 MG tablet Take 100 mg by mouth 4 (four) times daily.    . nitroGLYCERIN (NITROSTAT) 0.3 MG SL tablet Place 1 tablet (0.3 mg total) under the tongue every 5 (five) minutes as needed for chest pain. 25 tablet 11   No current facility-administered medications for this visit.    Allergies:   Patient has no known allergies.    Social History:  The patient  reports that he quit smoking about 48 years ago. He has never used  smokeless tobacco. He reports that he does not drink alcohol and does not use drugs.   Family History:  The patient's family history includes Hypertension in his father and mother; Stroke in his father and mother.    ROS:   Please see the history of present illness.   Review of Systems  Hematologic/Lymphatic: Negative for bleeding problem.  All other systems reviewed and are negative.     PHYSICAL EXAM: VS:  BP 140/72   Pulse 76   Ht 5' 9.5" (1.765 m)   Wt 154 lb (69.9 kg)   BMI 22.42 kg/m     Wt Readings from Last 3 Encounters:  02/20/20 154 lb (69.9 kg)  12/18/18 142 lb 3.2 oz (64.5 kg)  03/23/17 149 lb (67.6 kg)     GEN: Well nourished, well developed, in no acute distress  HEENT: normal  Neck: no JVD, no masses Cardiac:  Normal S1/S2, RRR; no murmur ,  no rubs or gallops, no edema  Respiratory:  clear to auscultation bilaterally, no wheezing, rhonchi or rales. 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  Skin: warm and dry  Neuro:  CNs II-XII intact, Strength and sensation are intact Psych: Normal affect   EKG:  EKG is  ordered today. NSR with rate 76.  Normal. I have personally reviewed and interpreted this study.     Recent Labs: Lab Results  Component Value Date   WBC 7.9 07/29/2014   HGB 12.1 (L) 07/29/2014   HCT 35.4 (L) 07/29/2014   PLT 260 07/29/2014   GLUCOSE 87 07/27/2016   CHOL 133 07/27/2016   TRIG 56 07/27/2016   HDL 64 07/27/2016   LDLCALC 58 07/27/2016   ALT 19 07/27/2016   AST 22 07/27/2016   NA 142 07/27/2016   K 4.8 07/27/2016   CL 101 07/27/2016   CREATININE 1.23 07/27/2016   BUN 16 07/27/2016   CO2 25 07/27/2016   INR 1.09 07/28/2014   HGBA1C (H) 07/16/2009    6.1 (NOTE)                                                                       According to the ADA Clinical Practice Recommendations for 2011, when HbA1c is used as a screening test:   >=6.5%   Diagnostic of  Diabetes Mellitus           (  if abnormal result  is confirmed)  5.7-6.4%   Increased risk of developing Diabetes Mellitus  References:Diagnosis and Classification of Diabetes Mellitus,Diabetes [id number] 1):S62-S69 and Standards of Medical Care in         Diabetes - 2011,Diabetes [id number]  (Suppl 1):S11-S61.    Dated 10/01/18: cholesterol 140, triglycerides 66, HDL 70, LDL 57. Creatinine 1.08. otherwise CMET normal Dated 10/17/19: cholesterol 134, triglycerides 72, HDL 53, LDL 57. Creatinine 1.25. otherwise CMET normal.   Myoview 03/23/17: Study Highlights    The left ventricular ejection fraction is normal (55-65%).  Nuclear stress EF: 58%.  No T wave inversion was noted during stress.  There was no ST segment deviation noted during stress.  This is a low risk study.   Normal perfusion. LVEF 58% with normal wall motion. This is a low risk study.      ASSESSMENT AND PLAN:  1. Coronary artery disease involving native coronary artery of native heart.  Multiple stents in the past. Last cath with p DES to the LCx and DES to the RCA in June 2016.  Myoview study in Feb 2019 was normal. He has recurrent class 2-3 angina. We discussed increasing his medication versus repeat cardiac cath. He would favor proceeding with cardiac cath. Will update labs and schedule for next week. The procedure and risks were reviewed including but not limited to death, myocardial infarction, stroke, arrythmias, bleeding, transfusion, emergency surgery, dye allergy, or renal dysfunction. The patient voices understanding and is agreeable to proceed.   2. Hypertension:  Excellent control.  3. Hyperlipidemia:  On crestor - last lipids looked very good.    Medication Changes: Current medicines are reviewed at length with the patient today.  Concerns regarding medicines are as outlined above.  The following changes have been made:  none   Labs/ tests ordered today include:   Labs today. Covid  test Cardiac cath next week.   Signed, Ortencia Askari Martin[place] MD, [facility]    02/20/2020 3:32 PM

## 2020-02-20 ENCOUNTER — Other Ambulatory Visit: Payer: Self-pay | Admitting: Cardiology

## 2020-02-20 ENCOUNTER — Ambulatory Visit (INDEPENDENT_AMBULATORY_CARE_PROVIDER_SITE_OTHER): Payer: Medicare Other | Admitting: Cardiology

## 2020-02-20 ENCOUNTER — Other Ambulatory Visit: Payer: Self-pay

## 2020-02-20 ENCOUNTER — Encounter: Payer: Self-pay | Admitting: Cardiology

## 2020-02-20 VITALS — BP 140/72 | HR 76 | Ht 69.5 in | Wt 154.0 lb

## 2020-02-20 DIAGNOSIS — I209 Angina pectoris, unspecified: Secondary | ICD-10-CM

## 2020-02-20 DIAGNOSIS — I10 Essential (primary) hypertension: Secondary | ICD-10-CM | POA: Diagnosis not present

## 2020-02-20 DIAGNOSIS — E7800 Pure hypercholesterolemia, unspecified: Secondary | ICD-10-CM | POA: Diagnosis not present

## 2020-02-20 DIAGNOSIS — I25110 Atherosclerotic heart disease of native coronary artery with unstable angina pectoris: Secondary | ICD-10-CM | POA: Diagnosis not present

## 2020-02-20 MED ORDER — SODIUM CHLORIDE 0.9% FLUSH: 3.0000 mL | Freq: Two times a day (BID) | INTRAVENOUS | Status: AC

## 2020-02-20 MED ORDER — NITROGLYCERIN 0.3 MG SL SUBL: 0.3000 mg | SUBLINGUAL_TABLET | SUBLINGUAL | 11 refills | Status: DC | PRN

## 2020-02-20 NOTE — Patient Instructions (Signed)
Medication Instructions:  Continue same medications *If you need a refill on your cardiac medications before your next appointment, please call your pharmacy*   Lab Work: Bmet,cbc,pt today   Testing/Procedures: Cardiac Cath  Follow instructions below   Follow-Up: At [facility], you and your health needs are our priority.  As part of our continuing mission to provide you with exceptional heart care, we have created designated Provider Care Teams.  These Care Teams include your primary Cardiologist (physician) and Advanced Practice Providers (APPs -  Physician Assistants and Nurse Practitioners) who all work together to provide you with the care you need, when you need it.  We recommend signing up for the patient portal called "MyChart".  Sign up information is provided on this After Visit Summary.  MyChart is used to connect with patients for Virtual Visits (Telemedicine).  Patients are able to view lab/test results, encounter notes, upcoming appointments, etc.  Non-urgent messages can be sent to your provider as well.   To learn more about what you can do with MyChart, go to [email or web address].    Your next appointment:  Wed  03/17/20 at 3:00 pm   The format for your next appointment: Office    Provider:  [place] [address] Dept: [phone] Loc: [place]  02/20/2020  You are scheduled for a Cardiac Cath on Tuesday 02/24/20,  with Dr. Martinique.  1. Please arrive at the [facility] (Main Entrance A) at [facility]: [address] [place], [place] [address] at 8:30 am (This time is two hours before your procedure to ensure your preparation). Free valet parking service is available.   Special note: Every effort is made to have your procedure done on time. Please understand that emergencies sometimes delay  scheduled procedures.  2. Diet: Do not eat solid foods after midnight.  The patient may have clear liquids until 5am upon the day of the procedure.  3. Labs: You will need to have blood drawn on Friday 1/14 at [place] office You do not need to be fasting.   Covid Test    Saturday 02/21/20 at [address] Thru Site at 29:47 am  4. Medication instructions in preparation for your procedure:     On the morning of your procedure, take your Aspirin and Plavix and any morning medicines NOT listed above.  You may use sips of water.  5. Plan for one night stay--bring personal belongings. 6. Bring a current list of your medications and current insurance cards. 7. You MUST have a responsible person to drive you home. 8. Someone MUST be with you the first 24 hours after you arrive home or your discharge will be delayed. 9. Please wear clothes that are easy to get on and off and wear slip-on shoes.  Thank you for allowing [place] to care for you!   -- [facility]

## 2020-02-21 ENCOUNTER — Other Ambulatory Visit (HOSPITAL_COMMUNITY)
Admission: RE | Admit: 2020-02-21 | Discharge: 2020-02-21 | Disposition: A | Discharge status: Home / Self Care | Payer: Medicare Other | Source: Ambulatory Visit | Attending: Cardiology | Admitting: Cardiology

## 2020-02-21 DIAGNOSIS — Z01812 Encounter for preprocedural laboratory examination: Secondary | ICD-10-CM | POA: Insufficient documentation

## 2020-02-21 DIAGNOSIS — Z20822 Contact with and (suspected) exposure to covid-19: Secondary | ICD-10-CM | POA: Insufficient documentation

## 2020-02-21 LAB — CBC WITH DIFFERENTIAL/PLATELET
Basophils Absolute: 0.1 10*3/uL (ref 0.0–0.2)
Basos: 1 %
EOS (ABSOLUTE): 0.3 10*3/uL (ref 0.0–0.4)
Eos: 2 %
Hematocrit: 41.1 % (ref 37.5–51.0)
Hemoglobin: 14 g/dL (ref 13.0–17.7)
Immature Grans (Abs): 0 10*3/uL (ref 0.0–0.1)
Immature Granulocytes: 0 %
Lymphocytes Absolute: 3.1 10*3/uL (ref 0.7–3.1)
Lymphs: 24 %
MCH: 31.7 pg (ref 26.6–33.0)
MCHC: 34.1 g/dL (ref 31.5–35.7)
MCV: 93 fL (ref 79–97)
Monocytes Absolute: 1.1 10*3/uL — ABNORMAL HIGH (ref 0.1–0.9)
Monocytes: 8 %
Neutrophils Absolute: 8.7 10*3/uL — ABNORMAL HIGH (ref 1.4–7.0)
Neutrophils: 65 %
Platelets: 336 10*3/uL (ref 150–450)
RBC: 4.42 x10E6/uL (ref 4.14–5.80)
RDW: 13 % (ref 11.6–15.4)
WBC: 13.4 10*3/uL — ABNORMAL HIGH (ref 3.4–10.8)

## 2020-02-21 LAB — SARS CORONAVIRUS 2 (TAT 6-24 HRS): SARS Coronavirus 2: NEGATIVE

## 2020-02-21 LAB — BASIC METABOLIC PANEL
BUN/Creatinine Ratio: 14 (ref 10–24)
BUN: 19 mg/dL (ref 8–27)
CO2: 21 mmol/L (ref 20–29)
Calcium: 9.3 mg/dL (ref 8.6–10.2)
Chloride: 102 mmol/L (ref 96–106)
Creatinine, Ser: 1.4 mg/dL — ABNORMAL HIGH (ref 0.76–1.27)
GFR calc Af Amer: 54 mL/min/{1.73_m2} — ABNORMAL LOW (ref >59)
GFR calc non Af Amer: 47 mL/min/{1.73_m2} — ABNORMAL LOW (ref >59)
Glucose: 79 mg/dL (ref 65–99)
Potassium: 4.7 mmol/L (ref 3.5–5.2)
Sodium: 139 mmol/L (ref 134–144)

## 2020-02-21 LAB — PT AND PTT
INR: 1 (ref 0.9–1.2)
Prothrombin Time: 10.3 s (ref 9.1–12.0)
aPTT: 28 s (ref 24–33)

## 2020-02-23 ENCOUNTER — Telehealth: Payer: Self-pay | Admitting: *Deleted

## 2020-02-23 NOTE — Telephone Encounter (Addendum)
Pt contacted pre-catheterization scheduled at [facility] for: Tuesday February 24, 2020 10:30 AM Verified arrival time and place: [place] [facility]) at: 8:30 AM   No solid food after midnight prior to cath, clear liquids until 5 AM day of procedure.   AM meds can be  taken pre-cath with sips of water including: ASA 81 mg Plavix 75 mg  Confirmed patient has responsible adult to drive home post procedure and be with patient first 24 hours after arriving home: yes  You are allowed ONE visitor in the waiting room during the time you are at the hospital for your procedure. Both you and your visitor must wear a mask once you enter the hospital.  Reviewed procedure/mask/visitor instructions reviewed with patient.            '

## 2020-02-23 NOTE — Telephone Encounter (Signed)
Pt returned call

## 2020-02-24 ENCOUNTER — Encounter (HOSPITAL_COMMUNITY)
Admission: RE | Disposition: A | Discharge status: Home / Self Care | Payer: Self-pay | Source: Home / Self Care | Attending: Cardiology

## 2020-02-24 ENCOUNTER — Ambulatory Visit (HOSPITAL_COMMUNITY)
Admission: RE | Admit: 2020-02-24 | Discharge: 2020-02-24 | Disposition: A | Discharge status: Home / Self Care | Payer: Medicare Other | Attending: Cardiology | Admitting: Cardiology

## 2020-02-24 ENCOUNTER — Other Ambulatory Visit: Payer: Self-pay

## 2020-02-24 DIAGNOSIS — I10 Essential (primary) hypertension: Secondary | ICD-10-CM | POA: Diagnosis not present

## 2020-02-24 DIAGNOSIS — I25110 Atherosclerotic heart disease of native coronary artery with unstable angina pectoris: Secondary | ICD-10-CM | POA: Diagnosis not present

## 2020-02-24 DIAGNOSIS — Z7902 Long term (current) use of antithrombotics/antiplatelets: Secondary | ICD-10-CM | POA: Insufficient documentation

## 2020-02-24 DIAGNOSIS — E785 Hyperlipidemia, unspecified: Secondary | ICD-10-CM | POA: Diagnosis present

## 2020-02-24 DIAGNOSIS — Z955 Presence of coronary angioplasty implant and graft: Secondary | ICD-10-CM | POA: Insufficient documentation

## 2020-02-24 DIAGNOSIS — I2510 Atherosclerotic heart disease of native coronary artery without angina pectoris: Secondary | ICD-10-CM | POA: Diagnosis present

## 2020-02-24 DIAGNOSIS — I25119 Atherosclerotic heart disease of native coronary artery with unspecified angina pectoris: Secondary | ICD-10-CM | POA: Diagnosis not present

## 2020-02-24 DIAGNOSIS — Z7951 Long term (current) use of inhaled steroids: Secondary | ICD-10-CM | POA: Diagnosis not present

## 2020-02-24 DIAGNOSIS — Z7982 Long term (current) use of aspirin: Secondary | ICD-10-CM | POA: Insufficient documentation

## 2020-02-24 DIAGNOSIS — Z79899 Other long term (current) drug therapy: Secondary | ICD-10-CM | POA: Insufficient documentation

## 2020-02-24 DIAGNOSIS — Z87891 Personal history of nicotine dependence: Secondary | ICD-10-CM | POA: Diagnosis not present

## 2020-02-24 DIAGNOSIS — I252 Old myocardial infarction: Secondary | ICD-10-CM

## 2020-02-24 DIAGNOSIS — I209 Angina pectoris, unspecified: Secondary | ICD-10-CM

## 2020-02-24 HISTORY — PX: LEFT HEART CATH AND CORONARY ANGIOGRAPHY: CATH118249

## 2020-02-24 SURGERY — LEFT HEART CATH AND CORONARY ANGIOGRAPHY
Anesthesia: LOCAL

## 2020-02-24 MED ORDER — SODIUM CHLORIDE 0.9% FLUSH: 3.0000 mL | INTRAVENOUS | Status: DC | PRN

## 2020-02-24 MED ORDER — SODIUM CHLORIDE 0.9 % WEIGHT BASED INFUSION: 1.0000 mL/kg/h | INTRAVENOUS | Status: AC

## 2020-02-24 MED ORDER — HEPARIN (PORCINE) IN NACL 1000-0.9 UT/500ML-% IV SOLN
INTRAVENOUS | Status: AC
  Filled 2020-02-24: qty 1000

## 2020-02-24 MED ORDER — FENTANYL CITRATE (PF) 100 MCG/2ML IJ SOLN
INTRAMUSCULAR | Status: DC | PRN
  Administered 2020-02-24: 25 ug via INTRAVENOUS

## 2020-02-24 MED ORDER — FENTANYL CITRATE (PF) 100 MCG/2ML IJ SOLN
INTRAMUSCULAR | Status: AC
  Filled 2020-02-24: qty 2

## 2020-02-24 MED ORDER — ONDANSETRON HCL 4 MG/2ML IJ SOLN: 4.0000 mg | Freq: Four times a day (QID) | INTRAMUSCULAR | Status: DC | PRN

## 2020-02-24 MED ORDER — LIDOCAINE HCL (PF) 1 % IJ SOLN
INTRAMUSCULAR | Status: DC | PRN
  Administered 2020-02-24: 1 mL

## 2020-02-24 MED ORDER — SODIUM CHLORIDE 0.9 % WEIGHT BASED INFUSION
3.0000 mL/kg/h | INTRAVENOUS | Status: AC
  Administered 2020-02-24: 3 mL/kg/h via INTRAVENOUS

## 2020-02-24 MED ORDER — SODIUM CHLORIDE 0.9 % WEIGHT BASED INFUSION: 1.0000 mL/kg/h | INTRAVENOUS | Status: DC

## 2020-02-24 MED ORDER — HEPARIN SODIUM (PORCINE) 1000 UNIT/ML IJ SOLN
INTRAMUSCULAR | Status: DC | PRN
  Administered 2020-02-24: 3500 [IU] via INTRAVENOUS

## 2020-02-24 MED ORDER — VERAPAMIL HCL 2.5 MG/ML IV SOLN
INTRAVENOUS | Status: DC | PRN
  Administered 2020-02-24: 10 mL via INTRA_ARTERIAL

## 2020-02-24 MED ORDER — LIDOCAINE HCL (PF) 1 % IJ SOLN
INTRAMUSCULAR | Status: AC
  Filled 2020-02-24: qty 30

## 2020-02-24 MED ORDER — ASPIRIN 81 MG PO CHEW: 81.0000 mg | CHEWABLE_TABLET | ORAL | Status: DC

## 2020-02-24 MED ORDER — SODIUM CHLORIDE 0.9 % IV SOLN: 250.0000 mL | INTRAVENOUS | Status: DC | PRN

## 2020-02-24 MED ORDER — VERAPAMIL HCL 2.5 MG/ML IV SOLN
INTRAVENOUS | Status: AC
  Filled 2020-02-24: qty 2

## 2020-02-24 MED ORDER — HEPARIN SODIUM (PORCINE) 1000 UNIT/ML IJ SOLN
INTRAMUSCULAR | Status: AC
  Filled 2020-02-24: qty 1

## 2020-02-24 MED ORDER — SODIUM CHLORIDE 0.9% FLUSH: 3.0000 mL | Freq: Two times a day (BID) | INTRAVENOUS | Status: DC

## 2020-02-24 MED ORDER — MIDAZOLAM HCL 2 MG/2ML IJ SOLN
INTRAMUSCULAR | Status: AC
  Filled 2020-02-24: qty 2

## 2020-02-24 MED ORDER — ISOSORBIDE MONONITRATE ER 30 MG PO TB24: 30.0000 mg | ORAL_TABLET | Freq: Every day | ORAL | 11 refills | Status: DC

## 2020-02-24 MED ORDER — IOHEXOL 350 MG/ML SOLN
INTRAVENOUS | Status: DC | PRN
  Administered 2020-02-24: 90 mL

## 2020-02-24 MED ORDER — MIDAZOLAM HCL 2 MG/2ML IJ SOLN
INTRAMUSCULAR | Status: DC | PRN
  Administered 2020-02-24: 1 mg via INTRAVENOUS

## 2020-02-24 MED ORDER — ACETAMINOPHEN 325 MG PO TABS: 650.0000 mg | ORAL_TABLET | ORAL | Status: DC | PRN

## 2020-02-24 MED ORDER — LABETALOL HCL 5 MG/ML IV SOLN: 10.0000 mg | INTRAVENOUS | Status: AC | PRN

## 2020-02-24 MED ORDER — HYDRALAZINE HCL 20 MG/ML IJ SOLN
10.0000 mg | INTRAMUSCULAR | Status: AC | PRN
  Administered 2020-02-24: 10 mg via INTRAVENOUS
  Filled 2020-02-24: qty 1

## 2020-02-24 MED ORDER — HEPARIN (PORCINE) IN NACL 1000-0.9 UT/500ML-% IV SOLN
INTRAVENOUS | Status: DC | PRN
  Administered 2020-02-24 (×2): 500 mL

## 2020-02-24 SURGICAL SUPPLY — 10 items
BAG SNAP BAND KOVER 36X36 (MISCELLANEOUS) ×2 IMPLANT
CATH INFINITI 5FR MULTPACK ANG (CATHETERS) ×2 IMPLANT
DEVICE RAD COMP TR BAND LRG (VASCULAR PRODUCTS) ×2 IMPLANT
GLIDESHEATH SLEND SS 6F .021 (SHEATH) ×2 IMPLANT
GUIDEWIRE INQWIRE 1.5J.035X260 (WIRE) ×1 IMPLANT
INQWIRE 1.5J .035X260CM (WIRE) ×2
KIT HEART LEFT (KITS) ×2 IMPLANT
PACK CARDIAC CATHETERIZATION (CUSTOM PROCEDURE TRAY) ×2 IMPLANT
TRANSDUCER W/STOPCOCK (MISCELLANEOUS) ×2 IMPLANT
TUBING CIL FLEX 10 FLL-RA (TUBING) ×2 IMPLANT

## 2020-02-24 NOTE — Interval H&P Note (Signed)
History and Physical Interval Note:  02/24/2020 11:15 AM  Kristopher Gonzalez  has presented today for surgery, with the diagnosis of angina.  The various methods of treatment have been discussed with the patient and family. After consideration of risks, benefits and other options for treatment, the patient has consented to  Procedure(s): LEFT HEART CATH AND CORONARY ANGIOGRAPHY (N/A) as a surgical intervention.  The patient's history has been reviewed, patient examined, no change in status, stable for surgery.  I have reviewed the patient's chart and labs.  Questions were answered to the patient's satisfaction.   Cath Lab Visit (complete for each Cath Lab visit)  Clinical Evaluation Leading to the Procedure:   ACS: Yes.    Non-ACS:    Anginal Classification: CCS III  Anti-ischemic medical therapy: Maximal Therapy (2 or more classes of medications)  Non-Invasive Test Results: No non-invasive testing performed  Prior CABG: No previous CABG        Collier Salina [facility] 02/24/2020 11:15 AM'

## 2020-02-24 NOTE — Discharge Instructions (Addendum)
Add isosorbide 30 mg daily   Radial Site Care  This sheet gives you information about how to care for yourself after your procedure. Your health care provider may also give you more specific instructions. If you have problems or questions, contact your health care provider. What can I expect after the procedure? After the procedure, it is common to have:  Bruising and tenderness at the catheter insertion area. Follow these instructions at home: Medicines  Take over-the-counter and prescription medicines only as told by your health care provider. Insertion site care  Follow instructions from your health care provider about how to take care of your insertion site. Make sure you: ? Wash your hands with soap and water before you change your bandage (dressing). If soap and water are not available, use hand sanitizer. ? Change your dressing as told by your health care provider. ? Leave stitches (sutures), skin glue, or adhesive strips in place. These skin closures may need to stay in place for 2 weeks or longer. If adhesive strip edges start to loosen and curl up, you may trim the loose edges. Do not remove adhesive strips completely unless your health care provider tells you to do that.  Check your insertion site every day for signs of infection. Check for: ? Redness, swelling, or pain. ? Fluid or blood. ? Pus or a bad smell. ? Warmth.  Do not take baths, swim, or use a hot tub until your health care provider approves.  You may shower 24-48 hours after the procedure, or as directed by your health care provider. ? Remove the dressing and gently wash the site with plain soap and water. ? Pat the area dry with a clean towel. ? Do not rub the site. That could cause bleeding.  Do not apply powder or lotion to the site. Activity  For 24 hours after the procedure, or as directed by your health care provider: ? Do not flex or bend the affected arm. ? Do not push or pull heavy objects with  the affected arm. ? Do not drive yourself home from the hospital or clinic. You may drive 24 hours after the procedure unless your health care provider tells you not to. ? Do not operate machinery or power tools.  Do not lift anything that is heavier than 10 lb (4.5 kg), or the limit that you are told, until your health care provider says that it is safe.  Ask your health care provider when it is okay to: ? Return to work or school. ? Resume usual physical activities or sports. ? Resume sexual activity.   General instructions  If the catheter site starts to bleed, raise your arm and put firm pressure on the site. If the bleeding does not stop, get help right away. This is a medical emergency.  If you went home on the same day as your procedure, a responsible adult should be with you for the first 24 hours after you arrive home.  Keep all follow-up visits as told by your health care provider. This is important. Contact a health care provider if:  You have a fever.  You have redness, swelling, or yellow drainage around your insertion site. Get help right away if:  You have unusual pain at the radial site.  The catheter insertion area swells very fast.  The insertion area is bleeding, and the bleeding does not stop when you hold steady pressure on the area.  Your arm or hand becomes pale, cool, tingly, or  numb. These symptoms may represent a serious problem that is an emergency. Do not wait to see if the symptoms will go away. Get medical help right away. Call your local emergency services (911 in the [place] Do not drive yourself to the hospital. Summary  After the procedure, it is common to have bruising and tenderness at the site.  Follow instructions from your health care provider about how to take care of your radial site wound. Check the wound every day for signs of infection.  Do not lift anything that is heavier than 10 lb (4.5 kg), or the limit that you are told, until your  health care provider says that it is safe. This information is not intended to replace advice given to you by your health care provider. Make sure you discuss any questions you have with your health care provider. Document Revised: 02/28/2017 Document Reviewed: 02/28/2017 Elsevier Patient Education  2021 [employer].

## 2020-02-25 ENCOUNTER — Encounter (HOSPITAL_COMMUNITY): Payer: Self-pay | Admitting: Cardiology

## 2020-03-15 NOTE — Progress Notes (Unsigned)
Cardiology Office Note   Date:  03/15/2020   ID:  Kristopher Gonzalez, DOB 1940/02/26, MRN [id number]  PCP:   Dr. Lia Hopping Cardiologist:  Dr. Anaiah Mcmannis [place]     No chief complaint on file.    History of Present Illness: Kristopher Gonzalez is a 80 y.o. male with a hx of HTN, HL, CAD status post stenting of the RCA in 1999 with a BMS, Cypher DES to LCx in 2005 and repeat stenting to the mid RCA 07/2009 with a DES secondary to ISR. Myoview 09/2010 normal.  Admitted 6/20-6/22/16 with unstable angina. Cardiac cath revealed two-vessel coronary artery disease involving the mid Cx lesion, 95% in-stent restenosis. The proximal to mid RCA stents were patent. Dist RCA lesion, 80% stenosed. He underwent successful two-vessel intervention with 2.25 x 16 Synergy drug-eluting stent placed in the distal RCA and 3.0 x 12 Synergy drug-eluting stent placed in the mid circumflex. Normal LVEDP. Dual antiplatelet therapy recommended for minimum of 1 year with ASA and plavix. Myoview study in Feb 2019 was normal.   When seen recently he reported recurrent angina over the past 2 months. Symptoms occur with exertion such as working in the yard or carrying groceries. He has not taken sl Ntg but has curtailed his activity to avoid chest pain. Describes this as anterior chest pressure like prior angina. He underwent cardiac cath which showed patent stents. Only obstructive disease in a small diagonal. Medical therapy recommended.   He was working in his work shop moving things around and again developed chest tightness. This improved when he used a rescue inhaler.     Past Medical History:  Diagnosis Date  . Asthma   . Cerebral aneurysm   . Coronary artery disease   . Hyperlipidemia   . Hypertension   . MI, old     Past Surgical History:  Procedure Laterality Date  . CARDIAC CATHETERIZATION  07/16/2009   EF 65%  . CARDIAC CATHETERIZATION  10/12/2006   EF 50-60%  . CARDIAC CATHETERIZATION N/A 07/28/2014    Procedure: Left Heart Cath and Coronary Angiography;  Surgeon: Corky Crafts, MD;  Location: [facility] INVASIVE CV LAB;  Service: Cardiovascular;  Laterality: N/A;  . CARDIAC CATHETERIZATION N/A 07/28/2014   Procedure: Coronary Stent Intervention;  Surgeon: Corky Crafts, MD;  Location: [facility] INVASIVE CV LAB;  Service: Cardiovascular;  Laterality: N/A;  . CARDIOVASCULAR STRESS TEST  07/02/2002   EF 50%  . CORONARY STENT PLACEMENT     RIGHT CORONARY IN 1999 USING 3.0 X AND 3.0 X DUET STENTS  . CORONARY STENT PLACEMENT  2005   LEFT CIRCUMFLEX CORONARY WITH 2.5 X CYPHER STENT  . CORONARY STENT PLACEMENT     Mid RCA in June of 2011 with DES for in-stent restenosis.  [name] HERNIA REPAIR    . LEFT HEART CATH AND CORONARY ANGIOGRAPHY N/A 02/24/2020   Procedure: LEFT HEART CATH AND CORONARY ANGIOGRAPHY;  Surgeon: [place], Tanara Turvey M, MD;  Location: [facility] INVASIVE CV LAB;  Service: Cardiovascular;  Laterality: N/A;  . LUMBAR DISC SURGERY     X2  . MASTECTOMY    . TRANSTHORACIC ECHOCARDIOGRAM  11/23/2008   EF 55%     Current Outpatient Medications  Medication Sig Dispense Refill  . amLODipine (NORVASC) 10 MG tablet Take 1 tablet (10 mg total) by mouth daily. 90 tablet 3  . aspirin EC 81 MG tablet Take 81 mg by mouth daily. Swallow whole.    . clopidogrel (PLAVIX) 75 MG  tablet TAKE ONE TABLET BY MOUTH ONCE DAILY WITH FOOD (Patient taking differently: Take 75 mg by mouth every evening.) 30 tablet 6  . isosorbide mononitrate (IMDUR) 30 MG 24 hr tablet Take 1 tablet (30 mg total) by mouth daily. 30 tablet 11  . Multiple Vitamin (MULTIVITAMIN WITH MINERALS) TABS tablet Take 1 tablet by mouth daily.    . Multiple Vitamins-Minerals (PRESERVISION AREDS 2 PO) Take 1 tablet by mouth in the morning and at bedtime.    . nitroGLYCERIN (NITROSTAT) 0.3 MG SL tablet Place 1 tablet (0.3 mg total) under the tongue every 5 (five) minutes as needed for chest pain. 25 tablet 11  . pantoprazole (PROTONIX) 40 MG  tablet TAKE ONE TABLET BY MOUTH ONCE DAILY (Patient taking differently: Take 40 mg by mouth daily.) 30 tablet 6  . rosuvastatin (CRESTOR) 20 MG tablet Take 1 tablet (20 mg total) by mouth daily. (Patient taking differently: Take 20 mg by mouth every evening.) 90 tablet 3  . topiramate (TOPAMAX) 25 MG tablet Take 25 mg by mouth 2 (two) times daily.    . traMADol (ULTRAM) 50 MG tablet Take 100 mg by mouth 4 (four) times daily.     Current Facility-Administered Medications  Medication Dose Route Frequency Provider Last Rate Last Admin  . sodium chloride flush (NS) 0.9 % injection 3 mL  3 mL Intravenous Q12H Kristopher Gonzalez, Kristopher Denunzio M, MD        Allergies:   Patient has no known allergies.    Social History:  The patient  reports that he quit smoking about 48 years ago. He has never used smokeless tobacco. He reports that he does not drink alcohol and does not use drugs.   Family History:  The patient's family history includes Hypertension in his father and mother; Stroke in his father and mother.    ROS:   Please see the history of present illness.   Review of Systems  Hematologic/Lymphatic: Negative for bleeding problem.  All other systems reviewed and are negative.     PHYSICAL EXAM: VS:  There were no vitals taken for this visit.    Wt Readings from Last 3 Encounters:  02/24/20 152 lb (68.9 kg)  02/20/20 154 lb (69.9 kg)  12/18/18 142 lb 3.2 oz (64.5 kg)     GEN: Well nourished, well developed, in no acute distress  HEENT: normal  Neck: no JVD, no masses Cardiac:  Normal S1/S2, RRR; no murmur ,  no rubs or gallops, no edema  Respiratory: few squeeks in right base.  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 Left radial site without hematoma. Skin: warm and dry  Neuro:  CNs II-XII intact, Strength and sensation are intact Psych: Normal affect     Recent Labs: Lab Results  Component Value Date   WBC 13.4 (H) 02/20/2020    HGB 14.0 02/20/2020   HCT 41.1 02/20/2020   PLT 336 02/20/2020   GLUCOSE 79 02/20/2020   CHOL 133 07/27/2016   TRIG 56 07/27/2016   HDL 64 07/27/2016   LDLCALC 58 07/27/2016   ALT 19 07/27/2016   AST 22 07/27/2016   NA 139 02/20/2020   K 4.7 02/20/2020   CL 102 02/20/2020   CREATININE 1.40 (H) 02/20/2020   BUN 19 02/20/2020   CO2 21 02/20/2020   INR 1.0 02/20/2020   HGBA1C (H) 07/16/2009    6.1 (NOTE)  According to the ADA Clinical Practice Recommendations for 2011, when HbA1c is used as a screening test:   >=6.5%   Diagnostic of Diabetes Mellitus           (if abnormal result  is confirmed)  5.7-6.4%   Increased risk of developing Diabetes Mellitus  References:Diagnosis and Classification of Diabetes Mellitus,Diabetes KXFG,1829,93(ZJIRC 1):S62-S69 and Standards of Medical Care in         Diabetes - 2011,Diabetes VELF,8101,75  (Suppl 1):S11-S61.    Dated 10/01/18: cholesterol 140, triglycerides 66, HDL 70, LDL 57. Creatinine 1.08. otherwise CMET normal Dated 10/17/19: cholesterol 134, triglycerides 72, HDL 53, LDL 57. Creatinine 1.25. otherwise CMET normal.   Myoview 03/23/17: Study Highlights    The left ventricular ejection fraction is normal (55-65%).  Nuclear stress EF: 58%.  No T wave inversion was noted during stress.  There was no ST segment deviation noted during stress.  This is a low risk study.   Normal perfusion. LVEF 58% with normal wall motion. This is a low risk study.    Cardiac cath 02/24/20:  LEFT HEART CATH AND CORONARY ANGIOGRAPHY    Conclusion    Mid LM to Dist LM lesion is 45% stenosed.  Prox LAD to Mid LAD lesion is 25% stenosed.  2nd Diag lesion is 85% stenosed.  Ost Cx to Prox Cx lesion is 50% stenosed.  Previously placed Mid Cx to Dist Cx stent (unknown type) is widely patent.  Previously placed Prox RCA to Mid RCA stent (unknown type) is widely  patent.  Previously placed Dist RCA stent (unknown type) is widely patent.  Prox RCA lesion is 35% stenosed.  The left ventricular systolic function is normal.  LV end diastolic pressure is normal.  The left ventricular ejection fraction is 55-65% by visual estimate.   1. Continued patency of the stents in the LCx and RCA. Only obstructive disease is in a small second diagonal. This is chronic 2. Normal LV function   ASSESSMENT AND PLAN:  1. Coronary artery disease involving native coronary artery of native heart.  Multiple stents in the past. Cath with p DES to the LCx and DES to the RCA in June 2016.  Myoview study in Feb 2019 was normal. Recent cardiac cath showed patent stents. Based on his history I think his recent symptoms are more related to asthma. He was on a number of inhalers before but quit taking due to cost. At least has a rescue inhaler now. If symptoms persist may need to get back on maintenance inhaler.   2. Hypertension:  Excellent control.  3. Hyperlipidemia:  On crestor - last lipids looked very good.    Follow up in one year   Signed, Danyon Mcginness Martin[place] MD, [facility]    03/15/2020 7:37 AM

## 2020-03-17 ENCOUNTER — Encounter: Payer: Self-pay | Admitting: Cardiology

## 2020-03-17 ENCOUNTER — Other Ambulatory Visit: Payer: Self-pay

## 2020-03-17 ENCOUNTER — Ambulatory Visit (INDEPENDENT_AMBULATORY_CARE_PROVIDER_SITE_OTHER): Payer: Medicare Other | Admitting: Cardiology

## 2020-03-17 VITALS — BP 150/62 | HR 66 | Ht 70.0 in | Wt 151.2 lb

## 2020-03-17 DIAGNOSIS — I25118 Atherosclerotic heart disease of native coronary artery with other forms of angina pectoris: Secondary | ICD-10-CM | POA: Diagnosis not present

## 2020-03-17 DIAGNOSIS — I209 Angina pectoris, unspecified: Secondary | ICD-10-CM

## 2020-03-17 DIAGNOSIS — E7800 Pure hypercholesterolemia, unspecified: Secondary | ICD-10-CM | POA: Diagnosis not present

## 2020-03-30 DIAGNOSIS — H353131 Nonexudative age-related macular degeneration, bilateral, early dry stage: Secondary | ICD-10-CM | POA: Diagnosis not present

## 2020-03-30 DIAGNOSIS — H40013 Open angle with borderline findings, low risk, bilateral: Secondary | ICD-10-CM | POA: Diagnosis not present

## 2020-03-30 DIAGNOSIS — H524 Presbyopia: Secondary | ICD-10-CM | POA: Diagnosis not present

## 2020-03-30 DIAGNOSIS — H5231 Anisometropia: Secondary | ICD-10-CM | POA: Diagnosis not present

## 2020-03-30 DIAGNOSIS — H2513 Age-related nuclear cataract, bilateral: Secondary | ICD-10-CM | POA: Diagnosis not present

## 2020-03-30 DIAGNOSIS — H5203 Hypermetropia, bilateral: Secondary | ICD-10-CM | POA: Diagnosis not present

## 2020-03-30 DIAGNOSIS — H52223 Regular astigmatism, bilateral: Secondary | ICD-10-CM | POA: Diagnosis not present

## 2020-04-19 DIAGNOSIS — Z0000 Encounter for general adult medical examination without abnormal findings: Secondary | ICD-10-CM | POA: Diagnosis not present

## 2020-04-19 DIAGNOSIS — Z1331 Encounter for screening for depression: Secondary | ICD-10-CM | POA: Diagnosis not present

## 2020-04-19 DIAGNOSIS — I10 Essential (primary) hypertension: Secondary | ICD-10-CM | POA: Diagnosis not present

## 2020-04-19 DIAGNOSIS — I728 Aneurysm of other specified arteries: Secondary | ICD-10-CM | POA: Diagnosis not present

## 2020-04-19 DIAGNOSIS — H6121 Impacted cerumen, right ear: Secondary | ICD-10-CM | POA: Diagnosis not present

## 2020-04-19 DIAGNOSIS — M545 Low back pain, unspecified: Secondary | ICD-10-CM | POA: Diagnosis not present

## 2020-06-22 DIAGNOSIS — M5432 Sciatica, left side: Secondary | ICD-10-CM | POA: Diagnosis not present

## 2020-07-19 DIAGNOSIS — M545 Low back pain, unspecified: Secondary | ICD-10-CM | POA: Diagnosis not present

## 2020-07-19 DIAGNOSIS — I10 Essential (primary) hypertension: Secondary | ICD-10-CM | POA: Diagnosis not present

## 2020-07-19 DIAGNOSIS — K219 Gastro-esophageal reflux disease without esophagitis: Secondary | ICD-10-CM | POA: Diagnosis not present

## 2020-11-01 DIAGNOSIS — M545 Low back pain, unspecified: Secondary | ICD-10-CM | POA: Diagnosis not present

## 2020-11-01 DIAGNOSIS — K219 Gastro-esophageal reflux disease without esophagitis: Secondary | ICD-10-CM | POA: Diagnosis not present

## 2020-11-01 DIAGNOSIS — I10 Essential (primary) hypertension: Secondary | ICD-10-CM | POA: Diagnosis not present

## 2020-11-22 DIAGNOSIS — M85851 Other specified disorders of bone density and structure, right thigh: Secondary | ICD-10-CM | POA: Diagnosis not present

## 2020-11-22 DIAGNOSIS — M810 Age-related osteoporosis without current pathological fracture: Secondary | ICD-10-CM | POA: Diagnosis not present

## 2021-01-22 ENCOUNTER — Other Ambulatory Visit: Payer: Self-pay | Admitting: Cardiology

## 2021-02-02 DIAGNOSIS — I25118 Atherosclerotic heart disease of native coronary artery with other forms of angina pectoris: Secondary | ICD-10-CM | POA: Diagnosis not present

## 2021-02-02 DIAGNOSIS — M545 Low back pain, unspecified: Secondary | ICD-10-CM | POA: Diagnosis not present

## 2021-02-02 DIAGNOSIS — K219 Gastro-esophageal reflux disease without esophagitis: Secondary | ICD-10-CM | POA: Diagnosis not present

## 2021-02-02 DIAGNOSIS — I10 Essential (primary) hypertension: Secondary | ICD-10-CM | POA: Diagnosis not present

## 2021-03-13 NOTE — Progress Notes (Signed)
Cardiology Office Note   Date:  03/17/2021   ID:  Kristopher Gonzalez, DOB 1941-01-26, MRN [id number]  PCP:   Dr. Stoney Bang Cardiologist:  Dr. Josephene Marrone Martinique     Chief Complaint  Patient presents with   Follow-up    1 year.   Coronary Artery Disease     History of Present Illness: Kristopher Gonzalez is a 81 y.o. male with a hx of HTN, HL, CAD status post stenting of the RCA in 1999 with a BMS, Cypher DES to LCx in 2005 and repeat stenting to the mid RCA 07/2009 with a DES secondary to ISR. Myoview 09/2010 normal.  Admitted 6/20-6/22/16 with unstable angina. Cardiac cath revealed two-vessel coronary artery disease involving the mid Cx lesion, 95% in-stent restenosis. The proximal to mid RCA stents were patent. Dist RCA lesion, 80% stenosed. He underwent successful two-vessel intervention with 2.25 x 16 Synergy drug-eluting stent placed in the distal RCA and 3.0 x 12 Synergy drug-eluting stent placed in the mid circumflex.  Normal LVEDP. Dual antiplatelet therapy recommended for minimum of 1 year with ASA and plavix.  Myoview study in Feb 2019 was normal.   In Jan 2022 he reported recurrent angina. He underwent cardiac cath which showed patent stents. Only obstructive disease in a small diagonal. Medical therapy recommended. Since then he hasn't had much chest pain. Has noted rare symptoms of pounding in chest at night. His wife did pass away in September with dementia.   He was working in his work shop moving things around and again developed chest tightness. This improved when he used a rescue inhaler.     Past Medical History:  Diagnosis Date   Asthma    Cerebral aneurysm    Coronary artery disease    Hyperlipidemia    Hypertension    MI, old     Past Surgical History:  Procedure Laterality Date   CARDIAC CATHETERIZATION  07/16/2009   EF 65%   CARDIAC CATHETERIZATION  10/12/2006   EF 50-60%   CARDIAC CATHETERIZATION N/A 07/28/2014   Procedure: Left Heart Cath and Coronary  Angiography;  Surgeon: Jettie Booze, MD;  Location: [place] [facility];  Service: Cardiovascular;  Laterality: N/A;   CARDIAC CATHETERIZATION N/A 07/28/2014   Procedure: Coronary Stent Intervention;  Surgeon: Jettie Booze, MD;  Location: [place] [facility];  Service: Cardiovascular;  Laterality: N/A;   CARDIOVASCULAR STRESS TEST  07/02/2002   EF 50%   CORONARY STENT PLACEMENT     RIGHT CORONARY IN 1999 USING 3.0 X 23MM AND 3.0 X 13MM DUET STENTS   CORONARY STENT PLACEMENT  2005   LEFT CIRCUMFLEX CORONARY WITH 2.5 X 28MM CYPHER STENT   CORONARY STENT PLACEMENT     Mid RCA in June of 2011 with DES for in-stent restenosis.   HERNIA REPAIR     LEFT HEART CATH AND CORONARY ANGIOGRAPHY N/A 02/24/2020   Procedure: LEFT HEART CATH AND CORONARY ANGIOGRAPHY;  Surgeon: Martinique, Edrick Whitehorn M, MD;  Location: [place] [facility];  Service: Cardiovascular;  Laterality: N/A;   LUMBAR DISC SURGERY     X2   MASTECTOMY     TRANSTHORACIC ECHOCARDIOGRAM  11/23/2008   EF 55%     Current Outpatient Medications  Medication Sig Dispense Refill   amLODipine (NORVASC) 10 MG tablet Take 1 tablet (10 mg total) by mouth daily. 90 tablet 3   aspirin EC 81 MG tablet Take 81 mg by mouth daily. Swallow whole.     clopidogrel (PLAVIX)  75 MG tablet TAKE ONE TABLET BY MOUTH ONCE DAILY WITH FOOD 30 tablet 6   isosorbide mononitrate (IMDUR) 30 MG 24 hr tablet TAKE 1 TABLET BY MOUTH EVERY DAY 90 tablet 3   Multiple Vitamin (MULTIVITAMIN WITH MINERALS) TABS tablet Take 1 tablet by mouth daily.     Multiple Vitamins-Minerals (PRESERVISION AREDS 2 PO) Take 1 tablet by mouth in the morning and at bedtime.     nitroGLYCERIN (NITROSTAT) 0.3 MG SL tablet Place 1 tablet (0.3 mg total) under the tongue every 5 (five) minutes as needed for chest pain. 25 tablet 11   pantoprazole (PROTONIX) 40 MG tablet TAKE ONE TABLET BY MOUTH ONCE DAILY 30 tablet 6   rosuvastatin (CRESTOR) 20 MG tablet Take 1 tablet (20 mg total) by mouth daily.  90 tablet 3   topiramate (TOPAMAX) 25 MG tablet Take 25 mg by mouth 2 (two) times daily.     traMADol (ULTRAM) 50 MG tablet Take 100 mg by mouth 4 (four) times daily.     Current Facility-Administered Medications  Medication Dose Route Frequency Provider Last Rate Last Admin   sodium chloride flush (NS) 0.9 % injection 3 mL  3 mL Intravenous Q12H Martinique, Solymar Grace M, MD        Allergies:   Patient has no known allergies.    Social History:  The patient  reports that he quit smoking about 49 years ago. He has never used smokeless tobacco. He reports that he does not drink alcohol and does not use drugs.   Family History:  The patient's family history includes Hypertension in his father and mother; Stroke in his father and mother.    ROS:   Please see the history of present illness.   Review of Systems  Hematologic/Lymphatic: Negative for bleeding problem.  All other systems reviewed and are negative.    PHYSICAL EXAM: VS:  BP 134/66 (BP Location: Left Arm, Patient Position: Sitting, Cuff Size: Normal)    Pulse 66    Ht 5\' 10"  (1.778 m)    Wt 151 lb (68.5 kg)    BMI 21.67 kg/m     Wt Readings from Last 3 Encounters:  03/17/21 151 lb (68.5 kg)  03/17/20 151 lb 3.2 oz (68.6 kg)  02/24/20 152 lb (68.9 kg)     GEN: Well nourished, well developed, in no acute distress  HEENT: normal  Neck: no JVD, no masses Cardiac:  Normal S1/S2, RRR; no murmur ,  no rubs or gallops, no edema  Respiratory: few squeeks in right base.  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 Left radial site without hematoma. Skin: warm and dry  Neuro:  CNs II-XII intact, Strength and sensation are intact Psych: Normal affect     Recent Labs: Lab Results  Component Value Date   WBC 13.4 (H) 02/20/2020   HGB 14.0 02/20/2020   HCT 41.1 02/20/2020   PLT 336 02/20/2020   GLUCOSE 79 02/20/2020   CHOL 133 07/27/2016   TRIG 56 07/27/2016   HDL 64  07/27/2016   LDLCALC 58 07/27/2016   ALT 19 07/27/2016   AST 22 07/27/2016   NA 139 02/20/2020   K 4.7 02/20/2020   CL 102 02/20/2020   CREATININE 1.40 (H) 02/20/2020   BUN 19 02/20/2020   CO2 21 02/20/2020   INR 1.0 02/20/2020   HGBA1C (H) 07/16/2009    6.1 (NOTE)  According to the ADA Clinical Practice Recommendations for 2011, when HbA1c is used as a screening test:   >=6.5%   Diagnostic of Diabetes Mellitus           (if abnormal result  is confirmed)  5.7-6.4%   Increased risk of developing Diabetes Mellitus  References:Diagnosis and Classification of Diabetes Mellitus,Diabetes BSWH,6759,16(BWGYK 1):S62-S69 and Standards of Medical Care in         Diabetes - 2011,Diabetes ZLDJ,5701,77  (Suppl 1):S11-S61.    Dated 10/01/18: cholesterol 140, triglycerides 66, HDL 70, LDL 57. Creatinine 1.08. otherwise CMET normal Dated 10/17/19: cholesterol 134, triglycerides 72, HDL 53, LDL 57. Creatinine 1.25. otherwise CMET normal.   Myoview 03/23/17: Study Highlights    The left ventricular ejection fraction is normal (55-65%). Nuclear stress EF: 58%. No T wave inversion was noted during stress. There was no ST segment deviation noted during stress. This is a low risk study.   Normal perfusion. LVEF 58% with normal wall motion. This is a low risk study.     Cardiac cath 02/24/20:  LEFT HEART CATH AND CORONARY ANGIOGRAPHY    Conclusion    Mid LM to Dist LM lesion is 45% stenosed. Prox LAD to Mid LAD lesion is 25% stenosed. 2nd Diag lesion is 85% stenosed. Ost Cx to Prox Cx lesion is 50% stenosed. Previously placed Mid Cx to Dist Cx stent (unknown type) is widely patent. Previously placed Prox RCA to Mid RCA stent (unknown type) is widely patent. Previously placed Dist RCA stent (unknown type) is widely patent. Prox RCA lesion is 35% stenosed. The left ventricular systolic function is normal. LV end diastolic  pressure is normal. The left ventricular ejection fraction is 55-65% by visual estimate.   1. Continued patency of the stents in the LCx and RCA. Only obstructive disease is in a small second diagonal. This is chronic 2. Normal LV function   ASSESSMENT AND PLAN:  1. Coronary artery disease involving native coronary artery of native heart.  Multiple stents in the past. Cath with p DES to the LCx and DES to the RCA in June 2016.  Myoview study in Feb 2019 was normal. Cardiac cath one year ago showed patent stents. Rare angina.  2. Hypertension:  Excellent control.  3. Hyperlipidemia:  On crestor - labs followed by PCP.   4. Rare palpitations c/w PACs or PVCs    Follow up in one year   Signed, Brianni Manthe Martinique MD, [facility]    03/17/2021 2:17 PM

## 2021-03-17 ENCOUNTER — Encounter: Payer: Self-pay | Admitting: Cardiology

## 2021-03-17 ENCOUNTER — Other Ambulatory Visit: Payer: Self-pay

## 2021-03-17 ENCOUNTER — Ambulatory Visit (INDEPENDENT_AMBULATORY_CARE_PROVIDER_SITE_OTHER): Payer: Medicare Other | Admitting: Cardiology

## 2021-03-17 VITALS — BP 134/66 | HR 66 | Ht 70.0 in | Wt 151.0 lb

## 2021-03-17 DIAGNOSIS — I25118 Atherosclerotic heart disease of native coronary artery with other forms of angina pectoris: Secondary | ICD-10-CM

## 2021-03-17 DIAGNOSIS — I10 Essential (primary) hypertension: Secondary | ICD-10-CM

## 2021-03-17 DIAGNOSIS — E7800 Pure hypercholesterolemia, unspecified: Secondary | ICD-10-CM | POA: Diagnosis not present

## 2021-05-09 DIAGNOSIS — Z1331 Encounter for screening for depression: Secondary | ICD-10-CM | POA: Diagnosis not present

## 2021-05-09 DIAGNOSIS — M545 Low back pain, unspecified: Secondary | ICD-10-CM | POA: Diagnosis not present

## 2021-05-09 DIAGNOSIS — I10 Essential (primary) hypertension: Secondary | ICD-10-CM | POA: Diagnosis not present

## 2021-05-09 DIAGNOSIS — I25118 Atherosclerotic heart disease of native coronary artery with other forms of angina pectoris: Secondary | ICD-10-CM | POA: Diagnosis not present

## 2021-05-09 DIAGNOSIS — Z0000 Encounter for general adult medical examination without abnormal findings: Secondary | ICD-10-CM | POA: Diagnosis not present

## 2021-05-09 DIAGNOSIS — K219 Gastro-esophageal reflux disease without esophagitis: Secondary | ICD-10-CM | POA: Diagnosis not present

## 2021-09-07 DIAGNOSIS — K219 Gastro-esophageal reflux disease without esophagitis: Secondary | ICD-10-CM | POA: Diagnosis not present

## 2021-09-07 DIAGNOSIS — I25118 Atherosclerotic heart disease of native coronary artery with other forms of angina pectoris: Secondary | ICD-10-CM | POA: Diagnosis not present

## 2021-09-07 DIAGNOSIS — M545 Low back pain, unspecified: Secondary | ICD-10-CM | POA: Diagnosis not present

## 2021-09-07 DIAGNOSIS — I10 Essential (primary) hypertension: Secondary | ICD-10-CM | POA: Diagnosis not present

## 2021-09-08 DIAGNOSIS — H2513 Age-related nuclear cataract, bilateral: Secondary | ICD-10-CM | POA: Diagnosis not present

## 2021-09-08 DIAGNOSIS — H353131 Nonexudative age-related macular degeneration, bilateral, early dry stage: Secondary | ICD-10-CM | POA: Diagnosis not present

## 2021-10-11 DIAGNOSIS — H25812 Combined forms of age-related cataract, left eye: Secondary | ICD-10-CM | POA: Diagnosis not present

## 2021-10-20 DIAGNOSIS — H2511 Age-related nuclear cataract, right eye: Secondary | ICD-10-CM | POA: Diagnosis not present

## 2021-10-25 DIAGNOSIS — H25811 Combined forms of age-related cataract, right eye: Secondary | ICD-10-CM | POA: Diagnosis not present

## 2021-12-20 DIAGNOSIS — K219 Gastro-esophageal reflux disease without esophagitis: Secondary | ICD-10-CM | POA: Diagnosis not present

## 2021-12-20 DIAGNOSIS — J301 Allergic rhinitis due to pollen: Secondary | ICD-10-CM | POA: Diagnosis not present

## 2021-12-20 DIAGNOSIS — I25118 Atherosclerotic heart disease of native coronary artery with other forms of angina pectoris: Secondary | ICD-10-CM | POA: Diagnosis not present

## 2021-12-20 DIAGNOSIS — M545 Low back pain, unspecified: Secondary | ICD-10-CM | POA: Diagnosis not present

## 2021-12-20 DIAGNOSIS — I10 Essential (primary) hypertension: Secondary | ICD-10-CM | POA: Diagnosis not present

## 2021-12-23 DIAGNOSIS — I25118 Atherosclerotic heart disease of native coronary artery with other forms of angina pectoris: Secondary | ICD-10-CM | POA: Diagnosis not present

## 2021-12-23 DIAGNOSIS — I10 Essential (primary) hypertension: Secondary | ICD-10-CM | POA: Diagnosis not present

## 2021-12-23 DIAGNOSIS — J301 Allergic rhinitis due to pollen: Secondary | ICD-10-CM | POA: Diagnosis not present

## 2021-12-23 DIAGNOSIS — K219 Gastro-esophageal reflux disease without esophagitis: Secondary | ICD-10-CM | POA: Diagnosis not present

## 2021-12-23 DIAGNOSIS — M545 Low back pain, unspecified: Secondary | ICD-10-CM | POA: Diagnosis not present

## 2022-01-26 ENCOUNTER — Other Ambulatory Visit: Payer: Self-pay | Admitting: Cardiology

## 2022-03-07 NOTE — Progress Notes (Deleted)
Cardiology Office Note   Date:  03/07/2022   ID:  [name], DOB 03-20-1940, MRN [id number]  PCP:   Dr. Stoney Bang Cardiologist:  Dr. Ryszard Socarras Martinique     No chief complaint on file.    History of Present Illness: Kristopher Gonzalez is a 82 y.o. male with a hx of HTN, HL, CAD status post stenting of the RCA in 1999 with a BMS, Cypher DES to LCx in 2005 and repeat stenting to the mid RCA 07/2009 with a DES secondary to ISR. Myoview 09/2010 normal.  Admitted 6/20-6/22/16 with unstable angina. Cardiac cath revealed two-vessel coronary artery disease involving the mid Cx lesion, 95% in-stent restenosis. The proximal to mid RCA stents were patent. Dist RCA lesion, 80% stenosed. He underwent successful two-vessel intervention with 2.25 x 16 Synergy drug-eluting stent placed in the distal RCA and 3.0 x 12 Synergy drug-eluting stent placed in the mid circumflex.  Normal LVEDP. Dual antiplatelet therapy recommended for minimum of 1 year with ASA and plavix.  Myoview study in Feb 2019 was normal.   In Jan 2022 he reported recurrent angina. He underwent cardiac cath which showed patent stents. Only obstructive disease in a small diagonal. Medical therapy recommended. Since then he hasn't had much chest pain. Has noted rare symptoms of pounding in chest at night. His wife did pass away in September with dementia.   He was working in his work shop moving things around and again developed chest tightness. This improved when he used a rescue inhaler.     Past Medical History:  Diagnosis Date   Asthma    Cerebral aneurysm    Coronary artery disease    Hyperlipidemia    Hypertension    MI, old     Past Surgical History:  Procedure Laterality Date   CARDIAC CATHETERIZATION  07/16/2009   EF 65%   CARDIAC CATHETERIZATION  10/12/2006   EF 50-60%   CARDIAC CATHETERIZATION N/A 07/28/2014   Procedure: Left Heart Cath and Coronary Angiography;  Surgeon: Jettie Booze, MD;  Location: [place]  [facility];  Service: Cardiovascular;  Laterality: N/A;   CARDIAC CATHETERIZATION N/A 07/28/2014   Procedure: Coronary Stent Intervention;  Surgeon: Jettie Booze, MD;  Location: [place] [facility];  Service: Cardiovascular;  Laterality: N/A;   CARDIOVASCULAR STRESS TEST  07/02/2002   EF 50%   CORONARY STENT PLACEMENT     RIGHT CORONARY IN 1999 USING 3.0 X 23MM AND 3.0 X 13MM DUET STENTS   CORONARY STENT PLACEMENT  2005   LEFT CIRCUMFLEX CORONARY WITH 2.5 X 28MM CYPHER STENT   CORONARY STENT PLACEMENT     Mid RCA in June of 2011 with DES for in-stent restenosis.   HERNIA REPAIR     LEFT HEART CATH AND CORONARY ANGIOGRAPHY N/A 02/24/2020   Procedure: LEFT HEART CATH AND CORONARY ANGIOGRAPHY;  Surgeon: Martinique, Teruo Stilley M, MD;  Location: [place] [facility];  Service: Cardiovascular;  Laterality: N/A;   LUMBAR DISC SURGERY     X2   MASTECTOMY     TRANSTHORACIC ECHOCARDIOGRAM  11/23/2008   EF 55%     Current Outpatient Medications  Medication Sig Dispense Refill   amLODipine (NORVASC) 10 MG tablet Take 1 tablet (10 mg total) by mouth daily. 90 tablet 3   aspirin EC 81 MG tablet Take 81 mg by mouth daily. Swallow whole.     clopidogrel (PLAVIX) 75 MG tablet TAKE ONE TABLET BY MOUTH ONCE DAILY WITH FOOD 30 tablet 6  isosorbide mononitrate (IMDUR) 30 MG 24 hr tablet Take 1 tablet (30 mg total) by mouth daily. Please keep scheduled appointment with cardiologist 90 tablet 0   Multiple Vitamin (MULTIVITAMIN WITH MINERALS) TABS tablet Take 1 tablet by mouth daily.     Multiple Vitamins-Minerals (PRESERVISION AREDS 2 PO) Take 1 tablet by mouth in the morning and at bedtime.     nitroGLYCERIN (NITROSTAT) 0.3 MG SL tablet Place 1 tablet (0.3 mg total) under the tongue every 5 (five) minutes as needed for chest pain. 25 tablet 11   pantoprazole (PROTONIX) 40 MG tablet TAKE ONE TABLET BY MOUTH ONCE DAILY 30 tablet 6   rosuvastatin (CRESTOR) 20 MG tablet Take 1 tablet (20 mg total) by mouth daily. 90  tablet 3   topiramate (TOPAMAX) 25 MG tablet Take 25 mg by mouth 2 (two) times daily.     traMADol (ULTRAM) 50 MG tablet Take 100 mg by mouth 4 (four) times daily.     Current Facility-Administered Medications  Medication Dose Route Frequency Provider Last Rate Last Admin   sodium chloride flush (NS) 0.9 % injection 3 mL  3 mL Intravenous Q12H Martinique, Jahmir Salo M, MD        Allergies:   Patient has no known allergies.    Social History:  The patient  reports that he quit smoking about 50 years ago. He has never used smokeless tobacco. He reports that he does not drink alcohol and does not use drugs.   Family History:  The patient's family history includes Hypertension in his father and mother; Stroke in his father and mother.    ROS:   Please see the history of present illness.   Review of Systems  Hematologic/Lymphatic: Negative for bleeding problem.  All other systems reviewed and are negative.     PHYSICAL EXAM: VS:  There were no vitals taken for this visit.    Wt Readings from Last 3 Encounters:  03/17/21 151 lb (68.5 kg)  03/17/20 151 lb 3.2 oz (68.6 kg)  02/24/20 152 lb (68.9 kg)     GEN: Well nourished, well developed, in no acute distress  HEENT: normal  Neck: no JVD, no masses Cardiac:  Normal S1/S2, RRR; no murmur ,  no rubs or gallops, no edema  Respiratory: few squeeks in right base.  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 Left radial site without hematoma. Skin: warm and dry  Neuro:  CNs II-XII intact, Strength and sensation are intact Psych: Normal affect     Recent Labs: Lab Results  Component Value Date   WBC 13.4 (H) 02/20/2020   HGB 14.0 02/20/2020   HCT 41.1 02/20/2020   PLT 336 02/20/2020   GLUCOSE 79 02/20/2020   CHOL 133 07/27/2016   TRIG 56 07/27/2016   HDL 64 07/27/2016   LDLCALC 58 07/27/2016   ALT 19 07/27/2016   AST 22 07/27/2016   NA 139 02/20/2020   K 4.7 02/20/2020    CL 102 02/20/2020   CREATININE 1.40 (H) 02/20/2020   BUN 19 02/20/2020   CO2 21 02/20/2020   INR 1.0 02/20/2020   HGBA1C (H) 07/16/2009    6.1 (NOTE)  According to the ADA Clinical Practice Recommendations for 2011, when HbA1c is used as a screening test:   >=6.5%   Diagnostic of Diabetes Mellitus           (if abnormal result  is confirmed)  5.7-6.4%   Increased risk of developing Diabetes Mellitus  References:Diagnosis and Classification of Diabetes Mellitus,Diabetes [id number] 1):S62-S69 and Standards of Medical Care in         Diabetes - 2011,Diabetes [id number]  (Suppl 1):S11-S61.    Dated 10/01/18: cholesterol 140, triglycerides 66, HDL 70, LDL 57. Creatinine 1.08. otherwise CMET normal Dated 10/17/19: cholesterol 134, triglycerides 72, HDL 53, LDL 57. Creatinine 1.25. otherwise CMET normal.   Myoview 03/23/17: Study Highlights    The left ventricular ejection fraction is normal (55-65%). Nuclear stress EF: 58%. No T wave inversion was noted during stress. There was no ST segment deviation noted during stress. This is a low risk study.   Normal perfusion. LVEF 58% with normal wall motion. This is a low risk study.     Cardiac cath 02/24/20:  LEFT HEART CATH AND CORONARY ANGIOGRAPHY    Conclusion    Mid LM to Dist LM lesion is 45% stenosed. Prox LAD to Mid LAD lesion is 25% stenosed. 2nd Diag lesion is 85% stenosed. Ost Cx to Prox Cx lesion is 50% stenosed. Previously placed Mid Cx to Dist Cx stent (unknown type) is widely patent. Previously placed Prox RCA to Mid RCA stent (unknown type) is widely patent. Previously placed Dist RCA stent (unknown type) is widely patent. Prox RCA lesion is 35% stenosed. The left ventricular systolic function is normal. LV end diastolic pressure is normal. The left ventricular ejection fraction is 55-65% by visual estimate.   1. Continued patency of the  stents in the LCx and RCA. Only obstructive disease is in a small second diagonal. This is chronic 2. Normal LV function   ASSESSMENT AND PLAN:  1. Coronary artery disease involving native coronary artery of native heart.  Multiple stents in the past. Cath with p DES to the LCx and DES to the RCA in June 2016.  Myoview study in Feb 2019 was normal. Cardiac cath one year ago showed patent stents. Rare angina.  2. Hypertension:  Excellent control.  3. Hyperlipidemia:  On crestor - labs followed by PCP.   4. Rare palpitations c/w PACs or PVCs    Follow up in one year   Signed, Jacklyne Baik Martinique MD, [facility]    03/07/2022 4:31 PM

## 2022-03-13 ENCOUNTER — Ambulatory Visit: Payer: Medicare Other | Admitting: Cardiology

## 2022-03-17 NOTE — Progress Notes (Unsigned)
Cardiology Office Note   Date:  03/17/2022   ID:  Kristopher Gonzalez, DOB December 18, 1940, MRN [id number]  PCP:   Dr. Stoney Bang Cardiologist:  Dr. Shivonne Schwartzman [place]     No chief complaint on file.    History of Present Illness: Kristopher Gonzalez is a 82 y.o. male with a hx of HTN, HL, CAD status post stenting of the RCA in 1999 with a BMS, Cypher DES to LCx in 2005 and repeat stenting to the mid RCA 07/2009 with a DES secondary to ISR. Myoview 09/2010 normal.  Admitted 6/20-6/22/16 with unstable angina. Cardiac cath revealed two-vessel coronary artery disease involving the mid Cx lesion, 95% in-stent restenosis. The proximal to mid RCA stents were patent. Dist RCA lesion, 80% stenosed. He underwent successful two-vessel intervention with 2.25 x 16 Synergy drug-eluting stent placed in the distal RCA and 3.0 x 12 Synergy drug-eluting stent placed in the mid circumflex.  Normal LVEDP. Dual antiplatelet therapy recommended for minimum of 1 year with ASA and plavix.  Myoview study in Feb 2019 was normal.   In Jan 2022 he reported recurrent angina. He underwent cardiac cath which showed patent stents. Only obstructive disease in a small diagonal. Medical therapy recommended. Since then he hasn't had much chest pain. Has noted rare symptoms of pounding in chest at night. His wife did pass away in September with dementia.   He was working in his work shop moving things around and again developed chest tightness. This improved when he used a rescue inhaler.     Past Medical History:  Diagnosis Date   Asthma    Cerebral aneurysm    Coronary artery disease    Hyperlipidemia    Hypertension    MI, old     Past Surgical History:  Procedure Laterality Date   CARDIAC CATHETERIZATION  07/16/2009   EF 65%   CARDIAC CATHETERIZATION  10/12/2006   EF 50-60%   CARDIAC CATHETERIZATION N/A 07/28/2014   Procedure: Left Heart Cath and Coronary Angiography;  Surgeon: Jettie Booze, MD;  Location: [facility];  Service: Cardiovascular;  Laterality: N/A;   CARDIAC CATHETERIZATION N/A 07/28/2014   Procedure: Coronary Stent Intervention;  Surgeon: Jettie Booze, MD;  Location: [facility];  Service: Cardiovascular;  Laterality: N/A;   CARDIOVASCULAR STRESS TEST  07/02/2002   EF 50%   CORONARY STENT PLACEMENT     RIGHT CORONARY IN 1999 USING 3.0 X 23MM AND 3.0 X 13MM DUET STENTS   CORONARY STENT PLACEMENT  2005   LEFT CIRCUMFLEX CORONARY WITH 2.5 X 28MM CYPHER STENT   CORONARY STENT PLACEMENT     Mid RCA in June of 2011 with DES for in-stent restenosis.   HERNIA REPAIR     LEFT HEART CATH AND CORONARY ANGIOGRAPHY N/A 02/24/2020   Procedure: LEFT HEART CATH AND CORONARY ANGIOGRAPHY;  Surgeon: Martin[place], Lynora Dymond M, MD;  Location: [facility];  Service: Cardiovascular;  Laterality: N/A;   LUMBAR DISC SURGERY     X2   MASTECTOMY     TRANSTHORACIC ECHOCARDIOGRAM  11/23/2008   EF 55%     Current Outpatient Medications  Medication Sig Dispense Refill   amLODipine (NORVASC) 10 MG tablet Take 1 tablet (10 mg total) by mouth daily. 90 tablet 3   aspirin EC 81 MG tablet Take 81 mg by mouth daily. Swallow whole.     clopidogrel (PLAVIX) 75 MG tablet TAKE ONE TABLET BY MOUTH ONCE DAILY WITH FOOD 30 tablet 6  isosorbide mononitrate (IMDUR) 30 MG 24 hr tablet Take 1 tablet (30 mg total) by mouth daily. Please keep scheduled appointment with cardiologist 90 tablet 0   Multiple Vitamin (MULTIVITAMIN WITH MINERALS) TABS tablet Take 1 tablet by mouth daily.     Multiple Vitamins-Minerals (PRESERVISION AREDS 2 PO) Take 1 tablet by mouth in the morning and at bedtime.     nitroGLYCERIN (NITROSTAT) 0.3 MG SL tablet Place 1 tablet (0.3 mg total) under the tongue every 5 (five) minutes as needed for chest pain. 25 tablet 11   pantoprazole (PROTONIX) 40 MG tablet TAKE ONE TABLET BY MOUTH ONCE DAILY 30 tablet 6   rosuvastatin (CRESTOR) 20 MG tablet Take 1 tablet (20 mg total) by mouth daily. 90 tablet  3   topiramate (TOPAMAX) 25 MG tablet Take 25 mg by mouth 2 (two) times daily.     traMADol (ULTRAM) 50 MG tablet Take 100 mg by mouth 4 (four) times daily.     Current Facility-Administered Medications  Medication Dose Route Frequency Provider Last Rate Last Admin   sodium chloride flush (NS) 0.9 % injection 3 mL  3 mL Intravenous Q12H [place], Corliss Coggeshall M, MD        Allergies:   Patient has no known allergies.    Social History:  The patient  reports that he quit smoking about 50 years ago. He has never used smokeless tobacco. He reports that he does not drink alcohol and does not use drugs.   Family History:  The patient's family history includes Hypertension in his father and mother; Stroke in his father and mother.    ROS:   Please see the history of present illness.   Review of Systems  Hematologic/Lymphatic: Negative for bleeding problem.  All other systems reviewed and are negative.     PHYSICAL EXAM: VS:  There were no vitals taken for this visit.    Wt Readings from Last 3 Encounters:  03/17/21 151 lb (68.5 kg)  03/17/20 151 lb 3.2 oz (68.6 kg)  02/24/20 152 lb (68.9 kg)     GEN: Well nourished, well developed, in no acute distress  HEENT: normal  Neck: no JVD, no masses Cardiac:  Normal S1/S2, RRR; no murmur ,  no rubs or gallops, no edema  Respiratory: few squeeks in right base.  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 Left radial site without hematoma. Skin: warm and dry  Neuro:  CNs II-XII intact, Strength and sensation are intact Psych: Normal affect     Recent Labs: Lab Results  Component Value Date   WBC 13.4 (H) 02/20/2020   HGB 14.0 02/20/2020   HCT 41.1 02/20/2020   PLT 336 02/20/2020   GLUCOSE 79 02/20/2020   CHOL 133 07/27/2016   TRIG 56 07/27/2016   HDL 64 07/27/2016   LDLCALC 58 07/27/2016   ALT 19 07/27/2016   AST 22 07/27/2016   NA 139 02/20/2020   K 4.7 02/20/2020   CL 102  02/20/2020   CREATININE 1.40 (H) 02/20/2020   BUN 19 02/20/2020   CO2 21 02/20/2020   INR 1.0 02/20/2020   HGBA1C (H) 07/16/2009    6.1 (NOTE)  According to the ADA Clinical Practice Recommendations for 2011, when HbA1c is used as a screening test:   >=6.5%   Diagnostic of Diabetes Mellitus           (if abnormal result  is confirmed)  5.7-6.4%   Increased risk of developing Diabetes Mellitus  References:Diagnosis and Classification of Diabetes Mellitus,Diabetes [id number] 1):S62-S69 and Standards of Medical Care in         Diabetes - 2011,Diabetes [id number]  (Suppl 1):S11-S61.    Dated 10/01/18: cholesterol 140, triglycerides 66, HDL 70, LDL 57. Creatinine 1.08. otherwise CMET normal Dated 10/17/19: cholesterol 134, triglycerides 72, HDL 53, LDL 57. Creatinine 1.25. otherwise CMET normal.   Myoview 03/23/17: Study Highlights    The left ventricular ejection fraction is normal (55-65%). Nuclear stress EF: 58%. No T wave inversion was noted during stress. There was no ST segment deviation noted during stress. This is a low risk study.   Normal perfusion. LVEF 58% with normal wall motion. This is a low risk study.     Cardiac cath 02/24/20:  LEFT HEART CATH AND CORONARY ANGIOGRAPHY    Conclusion    Mid LM to Dist LM lesion is 45% stenosed. Prox LAD to Mid LAD lesion is 25% stenosed. 2nd Diag lesion is 85% stenosed. Ost Cx to Prox Cx lesion is 50% stenosed. Previously placed Mid Cx to Dist Cx stent (unknown type) is widely patent. Previously placed Prox RCA to Mid RCA stent (unknown type) is widely patent. Previously placed Dist RCA stent (unknown type) is widely patent. Prox RCA lesion is 35% stenosed. The left ventricular systolic function is normal. LV end diastolic pressure is normal. The left ventricular ejection fraction is 55-65% by visual estimate.   1. Continued patency of the stents in  the LCx and RCA. Only obstructive disease is in a small second diagonal. This is chronic 2. Normal LV function   ASSESSMENT AND PLAN:  1. Coronary artery disease involving native coronary artery of native heart.  Multiple stents in the past. Cath with p DES to the LCx and DES to the RCA in June 2016.  Myoview study in Feb 2019 was normal. Cardiac cath one year ago showed patent stents. Rare angina.  2. Hypertension:  Excellent control.  3. Hyperlipidemia:  On crestor - labs followed by PCP.   4. Rare palpitations c/w PACs or PVCs    Follow up in one year   Signed, Leyton Brownlee Martinique MD, [facility]    03/17/2022 8:22 AM

## 2022-03-20 ENCOUNTER — Ambulatory Visit: Payer: Medicare Other | Attending: Cardiology | Admitting: Cardiology

## 2022-03-20 ENCOUNTER — Encounter: Payer: Self-pay | Admitting: Cardiology

## 2022-03-20 VITALS — BP 132/62 | HR 60 | Ht 69.5 in | Wt 160.0 lb

## 2022-03-20 DIAGNOSIS — E7800 Pure hypercholesterolemia, unspecified: Secondary | ICD-10-CM

## 2022-03-20 DIAGNOSIS — I25118 Atherosclerotic heart disease of native coronary artery with other forms of angina pectoris: Secondary | ICD-10-CM

## 2022-03-20 DIAGNOSIS — I10 Essential (primary) hypertension: Secondary | ICD-10-CM | POA: Diagnosis not present

## 2022-03-20 MED ORDER — CLOPIDOGREL BISULFATE 75 MG PO TABS: 75.0000 mg | ORAL_TABLET | Freq: Every day | ORAL | 3 refills | Status: DC

## 2022-03-20 MED ORDER — NITROGLYCERIN 0.3 MG SL SUBL
0.3000 mg | SUBLINGUAL_TABLET | SUBLINGUAL | 11 refills | Status: DC | PRN
Start: 2022-03-20 — End: 2023-04-18

## 2022-03-20 MED ORDER — ISOSORBIDE MONONITRATE ER 30 MG PO TB24: 30.0000 mg | ORAL_TABLET | Freq: Every day | ORAL | 3 refills | Status: DC

## 2022-03-20 NOTE — Patient Instructions (Signed)
Medication Instructions:  No changes *If you need a refill on your cardiac medications before your next appointment, please call your pharmacy*  Follow-Up: At [facility], you and your health needs are our priority.  As part of our continuing mission to provide you with exceptional heart care, we have created designated Provider Care Teams.  These Care Teams include your primary Cardiologist (physician) and Advanced Practice Providers (APPs -  Physician Assistants and Nurse Practitioners) who all work together to provide you with the care you need, when you need it.  We recommend signing up for the patient portal called "MyChart".  Sign up information is provided on this After Visit Summary.  MyChart is used to connect with patients for Virtual Visits (Telemedicine).  Patients are able to view lab/test results, encounter notes, upcoming appointments, etc.  Non-urgent messages can be sent to your provider as well.   To learn more about what you can do with MyChart, go to [email or web address].    Your next appointment:   1 year(s)  Provider:   Peter Martinique, MD

## 2022-03-21 DIAGNOSIS — M545 Low back pain, unspecified: Secondary | ICD-10-CM | POA: Diagnosis not present

## 2022-03-21 DIAGNOSIS — I25118 Atherosclerotic heart disease of native coronary artery with other forms of angina pectoris: Secondary | ICD-10-CM | POA: Diagnosis not present

## 2022-03-21 DIAGNOSIS — I10 Essential (primary) hypertension: Secondary | ICD-10-CM | POA: Diagnosis not present

## 2022-03-21 DIAGNOSIS — L570 Actinic keratosis: Secondary | ICD-10-CM | POA: Diagnosis not present

## 2022-03-21 DIAGNOSIS — N1831 Chronic kidney disease, stage 3a: Secondary | ICD-10-CM | POA: Diagnosis not present

## 2022-03-21 DIAGNOSIS — K219 Gastro-esophageal reflux disease without esophagitis: Secondary | ICD-10-CM | POA: Diagnosis not present

## 2022-03-21 DIAGNOSIS — J301 Allergic rhinitis due to pollen: Secondary | ICD-10-CM | POA: Diagnosis not present

## 2022-06-26 DIAGNOSIS — Z0000 Encounter for general adult medical examination without abnormal findings: Secondary | ICD-10-CM | POA: Diagnosis not present

## 2022-06-26 DIAGNOSIS — Z1331 Encounter for screening for depression: Secondary | ICD-10-CM | POA: Diagnosis not present

## 2022-06-26 DIAGNOSIS — N1831 Chronic kidney disease, stage 3a: Secondary | ICD-10-CM | POA: Diagnosis not present

## 2022-06-26 DIAGNOSIS — I10 Essential (primary) hypertension: Secondary | ICD-10-CM | POA: Diagnosis not present

## 2022-06-26 DIAGNOSIS — I25118 Atherosclerotic heart disease of native coronary artery with other forms of angina pectoris: Secondary | ICD-10-CM | POA: Diagnosis not present

## 2022-06-26 DIAGNOSIS — J301 Allergic rhinitis due to pollen: Secondary | ICD-10-CM | POA: Diagnosis not present

## 2022-06-26 DIAGNOSIS — M5459 Other low back pain: Secondary | ICD-10-CM | POA: Diagnosis not present

## 2022-06-26 DIAGNOSIS — K219 Gastro-esophageal reflux disease without esophagitis: Secondary | ICD-10-CM | POA: Diagnosis not present

## 2022-06-28 DIAGNOSIS — K219 Gastro-esophageal reflux disease without esophagitis: Secondary | ICD-10-CM | POA: Diagnosis not present

## 2022-06-28 DIAGNOSIS — N1831 Chronic kidney disease, stage 3a: Secondary | ICD-10-CM | POA: Diagnosis not present

## 2022-06-28 DIAGNOSIS — M545 Low back pain, unspecified: Secondary | ICD-10-CM | POA: Diagnosis not present

## 2022-06-28 DIAGNOSIS — J301 Allergic rhinitis due to pollen: Secondary | ICD-10-CM | POA: Diagnosis not present

## 2022-06-28 DIAGNOSIS — L570 Actinic keratosis: Secondary | ICD-10-CM | POA: Diagnosis not present

## 2022-06-28 DIAGNOSIS — I10 Essential (primary) hypertension: Secondary | ICD-10-CM | POA: Diagnosis not present

## 2022-06-28 DIAGNOSIS — I25118 Atherosclerotic heart disease of native coronary artery with other forms of angina pectoris: Secondary | ICD-10-CM | POA: Diagnosis not present

## 2022-06-29 DIAGNOSIS — M170 Bilateral primary osteoarthritis of knee: Secondary | ICD-10-CM | POA: Diagnosis not present

## 2022-07-17 DIAGNOSIS — H26492 Other secondary cataract, left eye: Secondary | ICD-10-CM | POA: Diagnosis not present

## 2022-07-17 DIAGNOSIS — Z961 Presence of intraocular lens: Secondary | ICD-10-CM | POA: Diagnosis not present

## 2022-07-26 ENCOUNTER — Telehealth: Payer: Self-pay | Admitting: Cardiology

## 2022-07-26 MED ORDER — AMLODIPINE BESYLATE 10 MG PO TABS: 10.0000 mg | ORAL_TABLET | Freq: Every day | ORAL | 2 refills | Status: DC

## 2022-07-26 NOTE — Telephone Encounter (Signed)
Pt's medication was sent to pt's pharmacy as requested. Confirmation received.  °

## 2022-07-26 NOTE — Telephone Encounter (Signed)
*  STAT* If patient is at the pharmacy, call can be transferred to refill team.   1. Which medications need to be refilled? (please list name of each medication and dose if known)   amLODipine (NORVASC) 10 MG tablet    2. Which pharmacy/location (including street and city if local pharmacy) is medication to be sent to? [id number] - [place], [place] - [address]    3. Do they need a 30 day or 90 day supply? 90 day

## 2022-08-09 DIAGNOSIS — M170 Bilateral primary osteoarthritis of knee: Secondary | ICD-10-CM | POA: Diagnosis not present

## 2022-08-17 DIAGNOSIS — M170 Bilateral primary osteoarthritis of knee: Secondary | ICD-10-CM | POA: Diagnosis not present

## 2022-08-24 DIAGNOSIS — M170 Bilateral primary osteoarthritis of knee: Secondary | ICD-10-CM | POA: Diagnosis not present

## 2022-09-26 DIAGNOSIS — J301 Allergic rhinitis due to pollen: Secondary | ICD-10-CM | POA: Diagnosis not present

## 2022-09-26 DIAGNOSIS — Z1331 Encounter for screening for depression: Secondary | ICD-10-CM | POA: Diagnosis not present

## 2022-09-26 DIAGNOSIS — M5459 Other low back pain: Secondary | ICD-10-CM | POA: Diagnosis not present

## 2022-09-26 DIAGNOSIS — I25118 Atherosclerotic heart disease of native coronary artery with other forms of angina pectoris: Secondary | ICD-10-CM | POA: Diagnosis not present

## 2022-09-26 DIAGNOSIS — K219 Gastro-esophageal reflux disease without esophagitis: Secondary | ICD-10-CM | POA: Diagnosis not present

## 2022-09-26 DIAGNOSIS — N1832 Chronic kidney disease, stage 3b: Secondary | ICD-10-CM | POA: Diagnosis not present

## 2022-09-26 DIAGNOSIS — I10 Essential (primary) hypertension: Secondary | ICD-10-CM | POA: Diagnosis not present

## 2022-11-06 ENCOUNTER — Telehealth: Payer: Self-pay | Admitting: Cardiology

## 2022-11-06 ENCOUNTER — Other Ambulatory Visit: Payer: Self-pay | Admitting: Cardiology

## 2022-11-06 MED ORDER — AMLODIPINE BESYLATE 10 MG PO TABS: 10.0000 mg | ORAL_TABLET | Freq: Every day | ORAL | 1 refills | Status: DC

## 2022-11-06 NOTE — Telephone Encounter (Signed)
Please resend refill to pharmacy. Please advise

## 2022-11-06 NOTE — Telephone Encounter (Signed)
Medication has already been sent and confirmation received by pharmacy.  Notified patient

## 2022-11-06 NOTE — Telephone Encounter (Signed)
*  STAT* If patient is at the pharmacy, call can be transferred to refill team.   1. Which medications need to be refilled? (please list name of each medication and dose if known)    amLODipine (NORVASC) 10 MG tablet    2. Which pharmacy/location (including street and city if local pharmacy) is medication to be sent to?  [id number] - [place], [place] - [address]      3. Do they need a 30 day or 90 day supply? 90 day    Pt has been completely out of medication for a week

## 2023-01-08 DIAGNOSIS — J301 Allergic rhinitis due to pollen: Secondary | ICD-10-CM | POA: Diagnosis not present

## 2023-01-08 DIAGNOSIS — M1710 Unilateral primary osteoarthritis, unspecified knee: Secondary | ICD-10-CM | POA: Diagnosis not present

## 2023-01-08 DIAGNOSIS — K219 Gastro-esophageal reflux disease without esophagitis: Secondary | ICD-10-CM | POA: Diagnosis not present

## 2023-01-08 DIAGNOSIS — I25118 Atherosclerotic heart disease of native coronary artery with other forms of angina pectoris: Secondary | ICD-10-CM | POA: Diagnosis not present

## 2023-01-08 DIAGNOSIS — M5459 Other low back pain: Secondary | ICD-10-CM | POA: Diagnosis not present

## 2023-01-08 DIAGNOSIS — I10 Essential (primary) hypertension: Secondary | ICD-10-CM | POA: Diagnosis not present

## 2023-01-08 DIAGNOSIS — N1832 Chronic kidney disease, stage 3b: Secondary | ICD-10-CM | POA: Diagnosis not present

## 2023-02-13 ENCOUNTER — Other Ambulatory Visit: Payer: Self-pay | Admitting: Cardiology

## 2023-03-30 ENCOUNTER — Other Ambulatory Visit: Payer: Self-pay | Admitting: Cardiology

## 2023-04-12 DIAGNOSIS — M170 Bilateral primary osteoarthritis of knee: Secondary | ICD-10-CM | POA: Diagnosis not present

## 2023-04-16 NOTE — Progress Notes (Unsigned)
 Cardiology Office Note   Date:  04/18/2023   ID:  Kristopher Gonzalez, DOB [birth year], MRN [id number]  PCP:   Dr. Lia Hopping Cardiologist:  Dr. Charmel Pronovost [place]     Chief Complaint  Patient presents with   Edema    Patient stated that he has swelling in both lower legs and tops of feet    Coronary Artery Disease     History of Present Illness: Kristopher Gonzalez is a 83 y.o. male with a hx of HTN, HL, CAD status post stenting of the RCA in 1999 with a BMS, Cypher DES to LCx in 2005 and repeat stenting to the mid RCA 07/2009 with a DES secondary to ISR. Myoview 09/2010 normal.  Admitted 6/20-6/22/16 with unstable angina. Cardiac cath revealed two-vessel coronary artery disease involving the mid Cx lesion, 95% in-stent restenosis. The proximal to mid RCA stents were patent. Dist RCA lesion, 80% stenosed. He underwent successful two-vessel intervention with 2.25 x 16 Synergy drug-eluting stent placed in the distal RCA and 3.0 x 12 Synergy drug-eluting stent placed in the mid circumflex.  Normal LVEDP. Dual antiplatelet therapy recommended for minimum of 1 year with ASA and plavix.  Myoview study in Feb 2019 was normal.   In Jan 2022 he reported recurrent angina. He underwent cardiac cath which showed patent stents. Only obstructive disease in a small diagonal. Medical therapy recommended. Since then he hasn't had much chest pain. He no longer feels pounding in his chest. If he gets aggravated he will get some tightness in his chest that responds to an inhaler. Labs followed by PCP.   Past Medical History:  Diagnosis Date   Asthma    Cerebral aneurysm    Coronary artery disease    Hyperlipidemia    Hypertension    MI, old     Past Surgical History:  Procedure Laterality Date   CARDIAC CATHETERIZATION  07/16/2009   EF 65%   CARDIAC CATHETERIZATION  10/12/2006   EF 50-60%   CARDIAC CATHETERIZATION N/A 07/28/2014   Procedure: Left Heart Cath and Coronary Angiography;  Surgeon: Corky Crafts, MD;  Location: [facility];  Service: Cardiovascular;  Laterality: N/A;   CARDIAC CATHETERIZATION N/A 07/28/2014   Procedure: Coronary Stent Intervention;  Surgeon: Corky Crafts, MD;  Location: [facility] IN[facility];  Service: Cardiovascular;  Laterality: N/A;   CARDIOVASCULAR STRESS TEST  07/02/2002   EF 50%   CORONARY STENT PLACEMENT     RIGHT CORONARY IN 1999 USING 3.0 X AND 3.0 X DUET STENTS   CORONARY STENT PLACEMENT  2005   LEFT CIRCUMFLEX CORONARY WITH 2.5 X CYPHER STENT   CORONARY STENT PLACEMENT     Mid RCA in June of 2011 with DES for in-stent restenosis.   HERNIA REPAIR     LEFT HEART CATH AND CORONARY ANGIOGRAPHY N/A 02/24/2020   Procedure: LEFT HEART CATH AND CORONARY ANGIOGRAPHY;  Surgeon: [place], Nathanal Hermiz M, MD;  Location: [facility];  Service: Cardiovascular;  Laterality: N/A;   LUMBAR DISC SURGERY     X2   MASTECTOMY     TRANSTHORACIC ECHOCARDIOGRAM  11/23/2008   EF 55%     Current Outpatient Medications  Medication Sig Dispense Refill   amLODipine (NORVASC) 10 MG tablet TAKE 1 TABLET BY MOUTH EVERY DAY 90 tablet 0   aspirin EC 81 MG tablet Take 81 mg by mouth daily. Swallow whole.     clopidogrel (PLAVIX) 75 MG tablet TAKE 1 TABLET  BY MOUTH EVERY DAY 90 tablet 0   isosorbide mononitrate (IMDUR) 30 MG 24 hr tablet Take 1 tablet (30 mg total) by mouth daily. Please keep scheduled appointment with cardiologist 90 tablet 3   Multiple Vitamin (MULTIVITAMIN WITH MINERALS) TABS tablet Take 1 tablet by mouth daily.     Multiple Vitamins-Minerals (PRESERVISION AREDS 2 PO) Take 1 tablet by mouth in the morning and at bedtime.     nitroGLYCERIN (NITROSTAT) 0.3 MG SL tablet Place 1 tablet (0.3 mg total) under the tongue every 5 (five) minutes as needed for chest pain. 25 tablet 11   olmesartan (BENICAR) 20 MG tablet Take 1 tablet (20 mg total) by mouth daily. 30 tablet 11   pantoprazole (PROTONIX) 40 MG tablet TAKE ONE TABLET BY MOUTH ONCE  DAILY 30 tablet 6   rosuvastatin (CRESTOR) 20 MG tablet Take 1 tablet (20 mg total) by mouth daily. 90 tablet 3   traMADol (ULTRAM) 50 MG tablet Take 100 mg by mouth 4 (four) times daily.     Current Facility-Administered Medications  Medication Dose Route Frequency Provider Last Rate Last Admin   sodium chloride flush (NS) 0.9 % injection 3 mL  3 mL Intravenous Q12H [place], Zorana Brockwell M, MD        Allergies:   Patient has no known allergies.    Social History:  The patient  reports that he quit smoking about 51 years ago. His smoking use included cigarettes. He has never used smokeless tobacco. He reports that he does not drink alcohol and does not use drugs.   Family History:  The patient's family history includes Hypertension in his father and mother; Stroke in his father and mother.    ROS:   Please see the history of present illness.   Otherwise ROS is normal.     PHYSICAL EXAM: VS:  BP (!) 160/60 (BP Location: Left Arm, Cuff Size: Normal)   Pulse 71   Ht 5\' 10"  (1.778 m)   Wt 168 lb 6.4 oz (76.4 kg)   SpO2 98%   BMI 24.16 kg/m     Wt Readings from Last 3 Encounters:  04/18/23 168 lb 6.4 oz (76.4 kg)  03/20/22 160 lb (72.6 kg)  03/17/21 151 lb (68.5 kg)     GEN: Well nourished, well developed, in no acute distress  HEENT: normal  Neck: no JVD, no masses Cardiac:  Normal S1/S2, RRR; no murmur ,  no rubs or gallops, no edema  Respiratory: few squeeks in right base.  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 Left radial site without hematoma. Skin: warm and dry  Neuro:  CNs II-XII intact, Strength and sensation are intact Psych: Normal affect    EKG Interpretation Date/Time:  Wednesday April 18 2023 16:24:31 EDT Ventricular Rate:  71 PR Interval:  176 QRS Duration:  94 QT Interval:  390 QTC Calculation: 423 R Axis:   18  Text Interpretation: Normal sinus rhythm with sinus arrhythmia Normal ECG When  compared with ECG of Feb 12,2024  No significant change was found  Confirmed by [place], Maggy Wyble [id number]) on 04/18/2023 4:30:47 PM   Recent Labs: Lab Results  Component Value Date   WBC 13.4 (H) 02/20/2020   HGB 14.0 02/20/2020   HCT 41.1 02/20/2020   PLT 336 02/20/2020   GLUCOSE 79 02/20/2020   CHOL 133 07/27/2016   TRIG 56 07/27/2016   HDL 64 07/27/2016   LDLCALC 58 07/27/2016   ALT 19  07/27/2016   AST 22 07/27/2016   NA 139 02/20/2020   K 4.7 02/20/2020   CL 102 02/20/2020   CREATININE 1.40 (H) 02/20/2020   BUN 19 02/20/2020   CO2 21 02/20/2020   INR 1.0 02/20/2020   HGBA1C (H) 07/16/2009    6.1 (NOTE)                                                                       According to the ADA Clinical Practice Recommendations for 2011, when HbA1c is used as a screening test:   >=6.5%   Diagnostic of Diabetes Mellitus           (if abnormal result  is confirmed)  5.7-6.4%   Increased risk of developing Diabetes Mellitus  References:Diagnosis and Classification of Diabetes Mellitus,Diabetes Care,2011,34(Suppl 1):S62-S69 and Standards of Medical Care in         Diabetes - 2011,Diabetes Care,2011,34  (Suppl 1):S11-S61.    Dated 10/01/18: cholesterol 140, triglycerides 66, HDL 70, LDL 57. Creatinine 1.08. otherwise CMET normal Dated 10/17/19: cholesterol 134, triglycerides 72, HDL 53, LDL 57. Creatinine 1.25. otherwise CMET normal. Dated 01/08/23: cholesterol 164, triglycerides 91, HDL 56. Creatinine 1.28. otherwise BMET normal   Myoview 03/23/17: Study Highlights    The left ventricular ejection fraction is normal (55-65%). Nuclear stress EF: 58%. No T wave inversion was noted during stress. There was no ST segment deviation noted during stress. This is a low risk study.   Normal perfusion. LVEF 58% with normal wall motion. This is a low risk study.     Cardiac cath 02/24/20:  LEFT HEART CATH AND CORONARY ANGIOGRAPHY    Conclusion    Mid LM to Dist LM lesion is 45%  stenosed. Prox LAD to Mid LAD lesion is 25% stenosed. 2nd Diag lesion is 85% stenosed. Ost Cx to Prox Cx lesion is 50% stenosed. Previously placed Mid Cx to Dist Cx stent (unknown type) is widely patent. Previously placed Prox RCA to Mid RCA stent (unknown type) is widely patent. Previously placed Dist RCA stent (unknown type) is widely patent. Prox RCA lesion is 35% stenosed. The left ventricular systolic function is normal. LV end diastolic pressure is normal. The left ventricular ejection fraction is 55-65% by visual estimate.   1. Continued patency of the stents in the LCx and RCA. Only obstructive disease is in a small second diagonal. This is chronic 2. Normal LV function   ASSESSMENT AND PLAN:  1. Coronary artery disease involving native coronary artery of native heart.  Multiple stents in the past. Cath with p DES to the LCx and DES to the RCA in June 2016.  Myoview study in Feb 2019 was normal. Cardiac cath in 2022 showed patent stents. He has no significant angina. OK to stop ASA at this point and continue Plavix.   2. HTN. BP is high today. Recommend starting olmesartan 20 mg daily. Will follow up with Dr Olena Leatherwood in June. Would repeat BMET then. If BP improves significantly may be able to reduce amlodipine dose since this is likely resulting in some edema   3. Hyperlipidemia:  On crestor      Follow up in one year   Signed, Loany Neuroth [place] MD, [facility]    04/18/2023  4:41 PM

## 2023-04-17 DIAGNOSIS — M5459 Other low back pain: Secondary | ICD-10-CM | POA: Diagnosis not present

## 2023-04-17 DIAGNOSIS — K219 Gastro-esophageal reflux disease without esophagitis: Secondary | ICD-10-CM | POA: Diagnosis not present

## 2023-04-17 DIAGNOSIS — J301 Allergic rhinitis due to pollen: Secondary | ICD-10-CM | POA: Diagnosis not present

## 2023-04-17 DIAGNOSIS — M1710 Unilateral primary osteoarthritis, unspecified knee: Secondary | ICD-10-CM | POA: Diagnosis not present

## 2023-04-17 DIAGNOSIS — N1831 Chronic kidney disease, stage 3a: Secondary | ICD-10-CM | POA: Diagnosis not present

## 2023-04-17 DIAGNOSIS — I25118 Atherosclerotic heart disease of native coronary artery with other forms of angina pectoris: Secondary | ICD-10-CM | POA: Diagnosis not present

## 2023-04-17 DIAGNOSIS — I10 Essential (primary) hypertension: Secondary | ICD-10-CM | POA: Diagnosis not present

## 2023-04-18 ENCOUNTER — Encounter: Payer: Self-pay | Admitting: Cardiology

## 2023-04-18 ENCOUNTER — Ambulatory Visit: Payer: Medicare Other | Attending: Cardiology | Admitting: Cardiology

## 2023-04-18 VITALS — BP 160/60 | HR 71 | Ht 70.0 in | Wt 168.4 lb

## 2023-04-18 DIAGNOSIS — I25118 Atherosclerotic heart disease of native coronary artery with other forms of angina pectoris: Secondary | ICD-10-CM | POA: Insufficient documentation

## 2023-04-18 DIAGNOSIS — E7800 Pure hypercholesterolemia, unspecified: Secondary | ICD-10-CM | POA: Insufficient documentation

## 2023-04-18 DIAGNOSIS — I10 Essential (primary) hypertension: Secondary | ICD-10-CM | POA: Diagnosis not present

## 2023-04-18 MED ORDER — CLOPIDOGREL BISULFATE 75 MG PO TABS
75.0000 mg | ORAL_TABLET | Freq: Every day | ORAL | 3 refills | Status: AC
Start: 2023-04-18 — End: ?

## 2023-04-18 MED ORDER — OLMESARTAN MEDOXOMIL 20 MG PO TABS: 20.0000 mg | ORAL_TABLET | Freq: Every day | ORAL | 11 refills | Status: DC

## 2023-04-18 MED ORDER — NITROGLYCERIN 0.3 MG SL SUBL: 0.3000 mg | SUBLINGUAL_TABLET | SUBLINGUAL | 11 refills | Status: AC | PRN

## 2023-04-18 NOTE — Addendum Note (Signed)
 Addended by: Neoma Laming on: 04/18/2023 05:09 PM   Modules accepted: Orders

## 2023-04-18 NOTE — Patient Instructions (Signed)
 Medication Instructions:  Start Olmesartan 20 mg daily Stop Aspirin Continue all other medications *If you need a refill on your cardiac medications before your next appointment, please call your pharmacy*   Lab Work: None ordered   Testing/Procedures: None ordered   Follow-Up: At [facility], you and your health needs are our priority.  As part of our continuing mission to provide you with exceptional heart care, we have created designated Provider Care Teams.  These Care Teams include your primary Cardiologist (physician) and Advanced Practice Providers (APPs -  Physician Assistants and Nurse Practitioners) who all work together to provide you with the care you need, when you need it.  We recommend signing up for the patient portal called "MyChart".  Sign up information is provided on this After Visit Summary.  MyChart is used to connect with patients for Virtual Visits (Telemedicine).  Patients are able to view lab/test results, encounter notes, upcoming appointments, etc.  Non-urgent messages can be sent to your provider as well.   To learn more about what you can do with MyChart, go to [email or web address].    Your next appointment:  1 year   Call in Nov to schedule March appointment     Provider:  Dr.Jordan

## 2023-04-19 DIAGNOSIS — M170 Bilateral primary osteoarthritis of knee: Secondary | ICD-10-CM | POA: Diagnosis not present

## 2023-04-26 DIAGNOSIS — M170 Bilateral primary osteoarthritis of knee: Secondary | ICD-10-CM | POA: Diagnosis not present

## 2023-05-23 DIAGNOSIS — M25521 Pain in right elbow: Secondary | ICD-10-CM | POA: Diagnosis not present

## 2023-05-23 DIAGNOSIS — M25511 Pain in right shoulder: Secondary | ICD-10-CM | POA: Diagnosis not present

## 2023-06-13 ENCOUNTER — Telehealth: Payer: Self-pay | Admitting: Cardiology

## 2023-06-13 NOTE — Telephone Encounter (Signed)
   [facility] Pre-operative Risk Assessment    Request for surgical clearance:  What type of surgery is being performed?  Extraction of 2 teeth. Possible surgical extraction.    When is this surgery scheduled?  TBD   What type of clearance is required (medical clearance vs. Pharmacy clearance to hold med vs. Both)?  Both  Are there any medications that need to be held prior to surgery and how long? Please advise on holding Plavix .   Practice name and name of physician performing surgery?  [facility]  Burnette Carte, DDS  What is your office phone number?  [phone]  7.   What is your office fax number? [phone]  8.   Anesthesia type (None, local, MAC, general)?  Regular topical numbing, localized    Georgian Kirk 06/13/2023, 3:37 PM

## 2023-06-13 NOTE — Telephone Encounter (Signed)
   Primary Cardiologist: Peter [place], MD  Chart reviewed as part of pre-operative protocol coverage. Simple dental extractions (1-2 teeth) are considered low risk procedures per guidelines and generally do not require any specific cardiac clearance. It is also generally accepted that for simple extractions and dental cleanings, there is no need to interrupt blood thinner therapy.   However, if surgical extraction is required and bleeding risk is too high while continuing Plavix , it may be held for 5 days prior to procedure and should be resumed as soon as hemodynamically stable post procedure.   SBE prophylaxis is not required for the patient.  I will route this recommendation to the requesting party via Epic fax function and remove from pre-op pool.  Please call with questions.  Gerldine Koch, NP-C  06/13/2023, 4:10 PM [address] [place], [place] [address] Office [phone] Fax [phone]

## 2023-07-24 DIAGNOSIS — J301 Allergic rhinitis due to pollen: Secondary | ICD-10-CM | POA: Diagnosis not present

## 2023-07-24 DIAGNOSIS — Z1331 Encounter for screening for depression: Secondary | ICD-10-CM | POA: Diagnosis not present

## 2023-07-24 DIAGNOSIS — K219 Gastro-esophageal reflux disease without esophagitis: Secondary | ICD-10-CM | POA: Diagnosis not present

## 2023-07-24 DIAGNOSIS — N1831 Chronic kidney disease, stage 3a: Secondary | ICD-10-CM | POA: Diagnosis not present

## 2023-07-24 DIAGNOSIS — Z0000 Encounter for general adult medical examination without abnormal findings: Secondary | ICD-10-CM | POA: Diagnosis not present

## 2023-07-24 DIAGNOSIS — M5459 Other low back pain: Secondary | ICD-10-CM | POA: Diagnosis not present

## 2023-07-24 DIAGNOSIS — I25118 Atherosclerotic heart disease of native coronary artery with other forms of angina pectoris: Secondary | ICD-10-CM | POA: Diagnosis not present

## 2023-07-24 DIAGNOSIS — I10 Essential (primary) hypertension: Secondary | ICD-10-CM | POA: Diagnosis not present

## 2023-07-24 DIAGNOSIS — M1710 Unilateral primary osteoarthritis, unspecified knee: Secondary | ICD-10-CM | POA: Diagnosis not present

## 2023-07-25 ENCOUNTER — Other Ambulatory Visit (HOSPITAL_COMMUNITY): Payer: Self-pay | Admitting: Internal Medicine

## 2023-07-25 DIAGNOSIS — M810 Age-related osteoporosis without current pathological fracture: Secondary | ICD-10-CM

## 2023-07-27 ENCOUNTER — Ambulatory Visit (HOSPITAL_COMMUNITY)
Admission: RE | Admit: 2023-07-27 | Discharge: 2023-07-27 | Disposition: A | Discharge status: Home / Self Care | Source: Ambulatory Visit | Attending: Internal Medicine | Admitting: Internal Medicine

## 2023-07-27 DIAGNOSIS — I25118 Atherosclerotic heart disease of native coronary artery with other forms of angina pectoris: Secondary | ICD-10-CM | POA: Diagnosis not present

## 2023-07-27 DIAGNOSIS — M1710 Unilateral primary osteoarthritis, unspecified knee: Secondary | ICD-10-CM | POA: Diagnosis not present

## 2023-07-27 DIAGNOSIS — M810 Age-related osteoporosis without current pathological fracture: Secondary | ICD-10-CM | POA: Insufficient documentation

## 2023-07-27 DIAGNOSIS — M8589 Other specified disorders of bone density and structure, multiple sites: Secondary | ICD-10-CM | POA: Diagnosis not present

## 2023-07-27 DIAGNOSIS — I10 Essential (primary) hypertension: Secondary | ICD-10-CM | POA: Diagnosis not present

## 2023-07-27 DIAGNOSIS — N1831 Chronic kidney disease, stage 3a: Secondary | ICD-10-CM | POA: Diagnosis not present

## 2023-07-27 DIAGNOSIS — Z13820 Encounter for screening for osteoporosis: Secondary | ICD-10-CM | POA: Diagnosis not present

## 2023-07-28 ENCOUNTER — Other Ambulatory Visit: Payer: Self-pay | Admitting: Cardiology

## 2023-07-29 ENCOUNTER — Other Ambulatory Visit: Payer: Self-pay | Admitting: Cardiology

## 2023-08-14 DIAGNOSIS — Z961 Presence of intraocular lens: Secondary | ICD-10-CM | POA: Diagnosis not present

## 2023-08-14 DIAGNOSIS — H04123 Dry eye syndrome of bilateral lacrimal glands: Secondary | ICD-10-CM | POA: Diagnosis not present

## 2023-08-14 DIAGNOSIS — H26491 Other secondary cataract, right eye: Secondary | ICD-10-CM | POA: Diagnosis not present

## 2023-08-14 DIAGNOSIS — H353131 Nonexudative age-related macular degeneration, bilateral, early dry stage: Secondary | ICD-10-CM | POA: Diagnosis not present

## 2023-10-30 DIAGNOSIS — K219 Gastro-esophageal reflux disease without esophagitis: Secondary | ICD-10-CM | POA: Diagnosis not present

## 2023-10-30 DIAGNOSIS — M79672 Pain in left foot: Secondary | ICD-10-CM | POA: Diagnosis not present

## 2023-10-30 DIAGNOSIS — M85872 Other specified disorders of bone density and structure, left ankle and foot: Secondary | ICD-10-CM | POA: Diagnosis not present

## 2023-10-30 DIAGNOSIS — M1710 Unilateral primary osteoarthritis, unspecified knee: Secondary | ICD-10-CM | POA: Diagnosis not present

## 2023-10-30 DIAGNOSIS — M5459 Other low back pain: Secondary | ICD-10-CM | POA: Diagnosis not present

## 2023-10-30 DIAGNOSIS — I10 Essential (primary) hypertension: Secondary | ICD-10-CM | POA: Diagnosis not present

## 2023-10-30 DIAGNOSIS — S92512D Displaced fracture of proximal phalanx of left lesser toe(s), subsequent encounter for fracture with routine healing: Secondary | ICD-10-CM | POA: Diagnosis not present

## 2023-10-30 DIAGNOSIS — J301 Allergic rhinitis due to pollen: Secondary | ICD-10-CM | POA: Diagnosis not present

## 2023-10-30 DIAGNOSIS — N1832 Chronic kidney disease, stage 3b: Secondary | ICD-10-CM | POA: Diagnosis not present

## 2023-10-30 DIAGNOSIS — M7752 Other enthesopathy of left foot: Secondary | ICD-10-CM | POA: Diagnosis not present

## 2023-12-18 DIAGNOSIS — I2510 Atherosclerotic heart disease of native coronary artery without angina pectoris: Secondary | ICD-10-CM | POA: Diagnosis not present

## 2023-12-18 DIAGNOSIS — Z6823 Body mass index (BMI) 23.0-23.9, adult: Secondary | ICD-10-CM | POA: Diagnosis not present

## 2023-12-18 DIAGNOSIS — R3129 Other microscopic hematuria: Secondary | ICD-10-CM | POA: Diagnosis not present

## 2023-12-18 DIAGNOSIS — K5792 Diverticulitis of intestine, part unspecified, without perforation or abscess without bleeding: Secondary | ICD-10-CM | POA: Diagnosis not present

## 2023-12-18 DIAGNOSIS — M519 Unspecified thoracic, thoracolumbar and lumbosacral intervertebral disc disorder: Secondary | ICD-10-CM | POA: Diagnosis not present

## 2023-12-18 DIAGNOSIS — I671 Cerebral aneurysm, nonruptured: Secondary | ICD-10-CM | POA: Diagnosis not present

## 2023-12-18 DIAGNOSIS — R1084 Generalized abdominal pain: Secondary | ICD-10-CM | POA: Diagnosis not present

## 2023-12-18 DIAGNOSIS — M5090 Cervical disc disorder, unspecified, unspecified cervical region: Secondary | ICD-10-CM | POA: Diagnosis not present

## 2023-12-26 DIAGNOSIS — R1084 Generalized abdominal pain: Secondary | ICD-10-CM | POA: Diagnosis not present

## 2023-12-26 DIAGNOSIS — N3289 Other specified disorders of bladder: Secondary | ICD-10-CM | POA: Diagnosis not present

## 2023-12-26 DIAGNOSIS — N201 Calculus of ureter: Secondary | ICD-10-CM | POA: Diagnosis not present

## 2024-01-01 DIAGNOSIS — Z6823 Body mass index (BMI) 23.0-23.9, adult: Secondary | ICD-10-CM | POA: Diagnosis not present

## 2024-01-01 DIAGNOSIS — N201 Calculus of ureter: Secondary | ICD-10-CM | POA: Diagnosis not present

## 2024-01-01 DIAGNOSIS — D72829 Elevated white blood cell count, unspecified: Secondary | ICD-10-CM | POA: Diagnosis not present

## 2024-01-01 DIAGNOSIS — I2510 Atherosclerotic heart disease of native coronary artery without angina pectoris: Secondary | ICD-10-CM | POA: Diagnosis not present

## 2024-01-01 DIAGNOSIS — K5792 Diverticulitis of intestine, part unspecified, without perforation or abscess without bleeding: Secondary | ICD-10-CM | POA: Diagnosis not present

## 2024-01-01 DIAGNOSIS — R1084 Generalized abdominal pain: Secondary | ICD-10-CM | POA: Diagnosis not present

## 2024-01-17 DIAGNOSIS — N200 Calculus of kidney: Secondary | ICD-10-CM | POA: Diagnosis not present

## 2024-01-25 ENCOUNTER — Other Ambulatory Visit: Payer: Self-pay

## 2024-01-25 DIAGNOSIS — N200 Calculus of kidney: Secondary | ICD-10-CM

## 2024-01-28 ENCOUNTER — Ambulatory Visit: Admitting: Urology

## 2024-01-28 ENCOUNTER — Encounter: Payer: Self-pay | Admitting: Urology

## 2024-01-28 ENCOUNTER — Ambulatory Visit (HOSPITAL_COMMUNITY)
Admission: RE | Admit: 2024-01-28 | Discharge: 2024-01-28 | Disposition: A | Discharge status: Home / Self Care | Source: Ambulatory Visit | Attending: Urology | Admitting: Urology

## 2024-01-28 VITALS — BP 142/61 | HR 72

## 2024-01-28 DIAGNOSIS — N200 Calculus of kidney: Secondary | ICD-10-CM | POA: Insufficient documentation

## 2024-01-28 DIAGNOSIS — N201 Calculus of ureter: Secondary | ICD-10-CM

## 2024-01-28 LAB — URINALYSIS, ROUTINE W REFLEX MICROSCOPIC
Bilirubin, UA: NEGATIVE
Glucose, UA: NEGATIVE
Ketones, UA: NEGATIVE
Leukocytes,UA: NEGATIVE
Nitrite, UA: NEGATIVE
Protein,UA: NEGATIVE
Specific Gravity, UA: 1.015 (ref 1.005–1.030)
Urobilinogen, Ur: 0.2 mg/dL (ref 0.2–1.0)
pH, UA: 6.5 (ref 5.0–7.5)

## 2024-01-28 LAB — MICROSCOPIC EXAMINATION: Bacteria, UA: NONE SEEN

## 2024-01-28 MED ORDER — TAMSULOSIN HCL 0.4 MG PO CAPS: 0.4000 mg | ORAL_CAPSULE | Freq: Every day | ORAL | 1 refills | Status: DC

## 2024-01-28 MED ORDER — HYDROCODONE-ACETAMINOPHEN 5-325 MG PO TABS: 1.0000 | ORAL_TABLET | Freq: Four times a day (QID) | ORAL | 0 refills | Status: AC | PRN

## 2024-01-28 NOTE — H&P (View-Only) (Signed)
 "  01/28/2024 12:00 PM   Kristopher Gonzalez Jan 11, 1941 [id number]  Referring provider: Orpha Yancey LABOR, MD [address] [place],  [place] [address]  nephrolithiasis   HPI: [name] is a 83yo here for evaluation of nephrolithiasis. He has been having intermittent left abdominal pain and underwent CT which showed a right 7mm distal ureteral calculus. He did not pass the stone and KUB from today shows a right 7mm distal ureteral calculus. IPSS 7 QOL 2.    PMH: Past Medical History:  Diagnosis Date   Asthma    Cerebral aneurysm    Coronary artery disease    Hyperlipidemia    Hypertension    MI, old     Surgical History: Past Surgical History:  Procedure Laterality Date   CARDIAC CATHETERIZATION  07/16/2009   EF 65%   CARDIAC CATHETERIZATION  10/12/2006   EF 50-60%   CARDIAC CATHETERIZATION N/A 07/28/2014   Procedure: Left Heart Cath and Coronary Angiography;  Surgeon: Candyce GORMAN Reek, MD;  Location: [facility] INVASIVE CV LAB;  Service: Cardiovascular;  Laterality: N/A;   CARDIAC CATHETERIZATION N/A 07/28/2014   Procedure: Coronary Stent Intervention;  Surgeon: Candyce GORMAN Reek, MD;  Location: [facility] INVASIVE CV LAB;  Service: Cardiovascular;  Laterality: N/A;   CARDIOVASCULAR STRESS TEST  07/02/2002   EF 50%   CORONARY STENT PLACEMENT     RIGHT CORONARY IN 1999 USING 3.0 X AND 3.0 X DUET STENTS   CORONARY STENT PLACEMENT  2005   LEFT CIRCUMFLEX CORONARY WITH 2.5 X CYPHER STENT   CORONARY STENT PLACEMENT     Mid RCA in June of 2011 with DES for in-stent restenosis.   HERNIA REPAIR     LEFT HEART CATH AND CORONARY ANGIOGRAPHY N/A 02/24/2020   Procedure: LEFT HEART CATH AND CORONARY ANGIOGRAPHY;  Surgeon: Jordan, Peter M, MD;  Location: [facility] INVASIVE CV LAB;  Service: Cardiovascular;  Laterality: N/A;   LUMBAR DISC SURGERY     X2   MASTECTOMY     TRANSTHORACIC ECHOCARDIOGRAM  11/23/2008   EF 55%    Home Medications:  Allergies as of 01/28/2024   No Known  Allergies      Medication List        Accurate as of January 28, 2024 12:00 PM. If you have any questions, ask your nurse or doctor.          amLODipine  10 MG tablet Commonly known as: NORVASC  TAKE 1 TABLET BY MOUTH EVERY DAY   clopidogrel  75 MG tablet Commonly known as: PLAVIX  Take 1 tablet (75 mg total) by mouth daily.   isosorbide  mononitrate 30 MG 24 hr tablet Commonly known as: IMDUR  Take 1 tablet (30 mg total) by mouth daily.   multivitamin with minerals Tabs tablet Take 1 tablet by mouth daily.   nitroGLYCERIN  0.3 MG SL tablet Commonly known as: NITROSTAT  Place 1 tablet (0.3 mg total) under the tongue every 5 (five) minutes as needed for chest pain.   olmesartan  20 MG tablet Commonly known as: BENICAR  Take 1 tablet (20 mg total) by mouth daily.   pantoprazole  40 MG tablet Commonly known as: PROTONIX  TAKE ONE TABLET BY MOUTH ONCE DAILY   PRESERVISION AREDS 2 PO Take 1 tablet by mouth in the morning and at bedtime.   rosuvastatin  20 MG tablet Commonly known as: CRESTOR  Take 1 tablet (20 mg total) by mouth daily.   traMADol  50 MG tablet Commonly known as: ULTRAM  Take 100 mg by mouth 4 (four) times daily.  Allergies: Allergies[1]  Family History: Family History  Problem Relation Age of Onset   Stroke Mother    Hypertension Mother    Stroke Father    Hypertension Father    Heart attack Neg Hx     Social History:  reports that he quit smoking about 52 years ago. His smoking use included cigarettes. He has never used smokeless tobacco. He reports that he does not drink alcohol and does not use drugs.  ROS: All other review of systems were reviewed and are negative except what is noted above in HPI  Physical Exam: BP (!) 142/61   Pulse 72   Constitutional:  Alert and oriented, No acute distress. HEENT: [place] AT, moist mucus membranes.  Trachea midline, no masses. Cardiovascular: No clubbing, cyanosis, or edema. Respiratory: Normal  respiratory effort, no increased work of breathing. GI: Abdomen is soft, nontender, nondistended, no abdominal masses GU: No CVA tenderness.  Lymph: No cervical or inguinal lymphadenopathy. Skin: No rashes, bruises or suspicious lesions. Neurologic: Grossly intact, no focal deficits, moving all 4 extremities. Psychiatric: Normal mood and affect.  Laboratory Data: Lab Results  Component Value Date   WBC 13.4 (H) 02/20/2020   HGB 14.0 02/20/2020   HCT 41.1 02/20/2020   MCV 93 02/20/2020   PLT 336 02/20/2020    Lab Results  Component Value Date   CREATININE 1.40 (H) 02/20/2020    No results found for: PSA  No results found for: TESTOSTERONE  Lab Results  Component Value Date   HGBA1C (H) 07/16/2009    6.1 (NOTE)                                                                       According to the ADA Clinical Practice Recommendations for 2011, when HbA1c is used as a screening test:   >=6.5%   Diagnostic of Diabetes Mellitus           (if abnormal result  is confirmed)  5.7-6.4%   Increased risk of developing Diabetes Mellitus  References:Diagnosis and Classification of Diabetes Mellitus,Diabetes Care,2011,34(Suppl 1):S62-S69 and Standards of Medical Care in         Diabetes - 2011,Diabetes Care,2011,34  (Suppl 1):S11-S61.    Urinalysis    Component Value Date/Time   COLORURINE YELLOW 07/17/2009 0934   APPEARANCEUR CLEAR 07/17/2009 0934   LABSPEC 1.018 07/17/2009 0934   PHURINE 7.0 07/17/2009 0934   GLUCOSEU NEGATIVE 07/17/2009 0934   HGBUR SMALL (A) 07/17/2009 0934   BILIRUBINUR NEGATIVE 07/17/2009 0934   KETONESUR NEGATIVE 07/17/2009 0934   PROTEINUR NEGATIVE 07/17/2009 0934   UROBILINOGEN 0.2 07/17/2009 0934   NITRITE NEGATIVE 07/17/2009 0934   LEUKOCYTESUR NEGATIVE 07/17/2009 0934    No results found for: LABMICR, WBCUA, RBCUA, LABEPIT, MUCUS, BACTERIA  Pertinent Imaging: KUb today: Images reviewed and discussed with the patient  No results  found for this or any previous visit.  No results found for this or any previous visit.  No results found for this or any previous visit.  No results found for this or any previous visit.  No results found for this or any previous visit.  No results found for this or any previous visit.  No results found for this or any previous visit.  No results found for this or any previous visit.   Assessment & Plan:    1. Kidney stones -We discussed the management of kidney stones. These options include observation, ureteroscopy, shockwave lithotripsy (ESWL) and percutaneous nephrolithotomy (PCNL). We discussed which options are relevant to the patient's stone(s). We discussed the natural history of kidney stones as well as the complications of untreated stones and the impact on quality of life without treatment as well as with each of the above listed treatments. We also discussed the efficacy of each treatment in its ability to clear the stone burden. With any of these management options I discussed the signs and symptoms of infection and the need for emergent treatment should these be experienced. For each option we discussed the ability of each procedure to clear the patient of their stone burden.   For observation I described the risks which include but are not limited to silent renal damage, life-threatening infection, need for emergent surgery, failure to pass stone and pain.   For ureteroscopy I described the risks which include bleeding, infection, damage to contiguous structures, positioning injury, ureteral stricture, ureteral avulsion, ureteral injury, need for prolonged ureteral stent, inability to perform ureteroscopy, need for an interval procedure, inability to clear stone burden, stent discomfort/pain, heart attack, stroke, pulmonary embolus and the inherent risks with general anesthesia.   For shockwave lithotripsy I described the risks which include arrhythmia, kidney contusion,  kidney hemorrhage, need for transfusion, pain, inability to adequately break up stone, inability to pass stone fragments, Steinstrasse, infection associated with obstructing stones, need for alternate surgical procedure, need for repeat shockwave lithotripsy, MI, CVA, PE and the inherent risks with anesthesia/conscious sedation.   For PCNL I described the risks including positioning injury, pneumothorax, hydrothorax, need for chest tube, inability to clear stone burden, renal laceration, arterial venous fistula or malformation, need for embolization of kidney, loss of kidney or renal function, need for repeat procedure, need for prolonged nephrostomy tube, ureteral avulsion, MI, CVA, PE and the inherent risks of general anesthesia.   - The patient would like to proceed with right ESWL   No follow-ups on file.  [name], MD  [facility] [place]      [1] No Known Allergies  "

## 2024-01-28 NOTE — Patient Instructions (Signed)
 ESWL for Kidney Stones  Extracorporeal shock wave lithotripsy (ESWL) is a treatment that can help break up kidney stones that are too large to pass on their own.  This is a nonsurgical procedure that breaks up a kidney stone with shock waves. These shock waves pass through your body and focus on the kidney stone. They cause the kidney stone to break into smaller pieces (fragments) while it is still in the urinary tract. The fragments of stone can pass more easily out of your body in the pee (urine). Tell a health care provider about: Any allergies you have. All medicines you are taking, including vitamins, herbs, eye drops, creams, and over-the-counter medicines. Any problems you or family members have had with anesthetic medicines. Any bleeding problems you have. Any surgeries you've had. Any medical conditions you have. Whether you're pregnant or may be pregnant. What are the risks? Your health care provider will talk with you about risks. These may include: Infection. Bleeding from the kidney. Bruising of the kidney or skin. Scarring of the kidney. This can lead to: Increased blood pressure. Poor kidney function. Return (recurrence) of kidney stones. Damage to other structures or organs. This may include the liver, colon, spleen, or pancreas. Blockage (obstruction) of the tube that carries pee from the kidney to the bladder (ureter). Failure of the kidney stone to break into fragments. What happens before the procedure? When to stop eating and drinking Follow instructions from your health care provider about what you may eat and drink. These may include: 8 hours before your procedure Stop eating most foods. Do not eat meat, fried foods, or fatty foods. Eat only light foods, such as toast or crackers. All liquids are okay except energy drinks and alcohol. 6 hours before your procedure Stop eating. Drink only clear liquids, such as water, clear fruit juice, black coffee, plain tea,  and sports drinks. Do not drink energy drinks or alcohol. 2 hours before your procedure Stop drinking all liquids. You may be allowed to take medicines with small sips of water. If you do not follow your health care provider's instructions, your procedure may be delayed or canceled. Medicines Ask your health care provider about: Changing or stopping your regular medicines. These include any diabetes medicines or blood thinners you take. Taking medicines such as aspirin and ibuprofen. These medicines can thin your blood. Do not take them unless your health care provider tells you to. Taking over-the-counter medicines, vitamins, herbs, and supplements. Tests You may have tests, such as: Blood tests. Pee (urine) tests. Imaging tests. This may include a CT scan. Surgery safety Ask your health care provider: How your surgery site will be marked. What steps will be taken to help prevent infection. These steps may include: Washing skin with a soap that kills germs. Receiving antibiotics. General instructions If you will be going home right after the procedure, plan to have a responsible adult: Take you home from the hospital or clinic. You will not be allowed to drive. Care for you for the time you are told. What happens during the procedure?  An IV will be inserted into one of your veins. You may be given: A sedative. This helps you relax. Anesthesia. This will: Numb certain areas of your body. Make you fall asleep for surgery. A water-filled cushion may be placed behind your kidney or on your abdomen. In some cases, you may be placed in a tub of lukewarm water. Your body will be positioned in a way that makes it  easier to target the kidney stone. An X-ray or ultrasound exam will be done to locate your stone. Shock waves will be aimed at the stone. If you are awake, you may feel a tapping sensation as the shock waves pass through your body. A small mesh tube (stent) may be placed in  your ureter. This will help keep pee flowing from the kidney if the fragments of the stone have been blocking the ureter. The stent will be removed at a later time by your health care provider. The procedure may vary among health care providers and hospitals. What happens after the procedure? Your blood pressure, heart rate, breathing rate, and blood oxygen level will be monitored until you leave the hospital or clinic. You may have an X-ray after the procedure to see how many of the kidney stones were broken up. This will also show how much of the stone has passed. If there are still large fragments after treatment, you may need to have a second procedure at a later time. This information is not intended to replace advice given to you by your health care provider. Make sure you discuss any questions you have with your health care provider. Document Revised: 08/05/2022 Document Reviewed: 05/26/2021 Elsevier Patient Education  2024 [employer].

## 2024-01-28 NOTE — Progress Notes (Signed)
 "  01/28/2024 12:00 PM   Kristopher Gonzalez Jan 11, 1941 [id number]  Referring provider: Orpha Yancey LABOR, MD [address] [place],  [place] [address]  nephrolithiasis   HPI: [name] is a 83yo here for evaluation of nephrolithiasis. He has been having intermittent left abdominal pain and underwent CT which showed a right 7mm distal ureteral calculus. He did not pass the stone and KUB from today shows a right 7mm distal ureteral calculus. IPSS 7 QOL 2.    PMH: Past Medical History:  Diagnosis Date   Asthma    Cerebral aneurysm    Coronary artery disease    Hyperlipidemia    Hypertension    MI, old     Surgical History: Past Surgical History:  Procedure Laterality Date   CARDIAC CATHETERIZATION  07/16/2009   EF 65%   CARDIAC CATHETERIZATION  10/12/2006   EF 50-60%   CARDIAC CATHETERIZATION N/A 07/28/2014   Procedure: Left Heart Cath and Coronary Angiography;  Surgeon: Candyce GORMAN Reek, MD;  Location: [facility] INVASIVE CV LAB;  Service: Cardiovascular;  Laterality: N/A;   CARDIAC CATHETERIZATION N/A 07/28/2014   Procedure: Coronary Stent Intervention;  Surgeon: Candyce GORMAN Reek, MD;  Location: [facility] INVASIVE CV LAB;  Service: Cardiovascular;  Laterality: N/A;   CARDIOVASCULAR STRESS TEST  07/02/2002   EF 50%   CORONARY STENT PLACEMENT     RIGHT CORONARY IN 1999 USING 3.0 X AND 3.0 X DUET STENTS   CORONARY STENT PLACEMENT  2005   LEFT CIRCUMFLEX CORONARY WITH 2.5 X CYPHER STENT   CORONARY STENT PLACEMENT     Mid RCA in June of 2011 with DES for in-stent restenosis.   HERNIA REPAIR     LEFT HEART CATH AND CORONARY ANGIOGRAPHY N/A 02/24/2020   Procedure: LEFT HEART CATH AND CORONARY ANGIOGRAPHY;  Surgeon: Jordan, Peter M, MD;  Location: [facility] INVASIVE CV LAB;  Service: Cardiovascular;  Laterality: N/A;   LUMBAR DISC SURGERY     X2   MASTECTOMY     TRANSTHORACIC ECHOCARDIOGRAM  11/23/2008   EF 55%    Home Medications:  Allergies as of 01/28/2024   No Known  Allergies      Medication List        Accurate as of January 28, 2024 12:00 PM. If you have any questions, ask your nurse or doctor.          amLODipine  10 MG tablet Commonly known as: NORVASC  TAKE 1 TABLET BY MOUTH EVERY DAY   clopidogrel  75 MG tablet Commonly known as: PLAVIX  Take 1 tablet (75 mg total) by mouth daily.   isosorbide  mononitrate 30 MG 24 hr tablet Commonly known as: IMDUR  Take 1 tablet (30 mg total) by mouth daily.   multivitamin with minerals Tabs tablet Take 1 tablet by mouth daily.   nitroGLYCERIN  0.3 MG SL tablet Commonly known as: NITROSTAT  Place 1 tablet (0.3 mg total) under the tongue every 5 (five) minutes as needed for chest pain.   olmesartan  20 MG tablet Commonly known as: BENICAR  Take 1 tablet (20 mg total) by mouth daily.   pantoprazole  40 MG tablet Commonly known as: PROTONIX  TAKE ONE TABLET BY MOUTH ONCE DAILY   PRESERVISION AREDS 2 PO Take 1 tablet by mouth in the morning and at bedtime.   rosuvastatin  20 MG tablet Commonly known as: CRESTOR  Take 1 tablet (20 mg total) by mouth daily.   traMADol  50 MG tablet Commonly known as: ULTRAM  Take 100 mg by mouth 4 (four) times daily.  Allergies: Allergies[1]  Family History: Family History  Problem Relation Age of Onset   Stroke Mother    Hypertension Mother    Stroke Father    Hypertension Father    Heart attack Neg Hx     Social History:  reports that he quit smoking about 52 years ago. His smoking use included cigarettes. He has never used smokeless tobacco. He reports that he does not drink alcohol and does not use drugs.  ROS: All other review of systems were reviewed and are negative except what is noted above in HPI  Physical Exam: BP (!) 142/61   Pulse 72   Constitutional:  Alert and oriented, No acute distress. HEENT: [place] AT, moist mucus membranes.  Trachea midline, no masses. Cardiovascular: No clubbing, cyanosis, or edema. Respiratory: Normal  respiratory effort, no increased work of breathing. GI: Abdomen is soft, nontender, nondistended, no abdominal masses GU: No CVA tenderness.  Lymph: No cervical or inguinal lymphadenopathy. Skin: No rashes, bruises or suspicious lesions. Neurologic: Grossly intact, no focal deficits, moving all 4 extremities. Psychiatric: Normal mood and affect.  Laboratory Data: Lab Results  Component Value Date   WBC 13.4 (H) 02/20/2020   HGB 14.0 02/20/2020   HCT 41.1 02/20/2020   MCV 93 02/20/2020   PLT 336 02/20/2020    Lab Results  Component Value Date   CREATININE 1.40 (H) 02/20/2020    No results found for: PSA  No results found for: TESTOSTERONE  Lab Results  Component Value Date   HGBA1C (H) 07/16/2009    6.1 (NOTE)                                                                       According to the ADA Clinical Practice Recommendations for 2011, when HbA1c is used as a screening test:   >=6.5%   Diagnostic of Diabetes Mellitus           (if abnormal result  is confirmed)  5.7-6.4%   Increased risk of developing Diabetes Mellitus  References:Diagnosis and Classification of Diabetes Mellitus,Diabetes Care,2011,34(Suppl 1):S62-S69 and Standards of Medical Care in         Diabetes - 2011,Diabetes Care,2011,34  (Suppl 1):S11-S61.    Urinalysis    Component Value Date/Time   COLORURINE YELLOW 07/17/2009 0934   APPEARANCEUR CLEAR 07/17/2009 0934   LABSPEC 1.018 07/17/2009 0934   PHURINE 7.0 07/17/2009 0934   GLUCOSEU NEGATIVE 07/17/2009 0934   HGBUR SMALL (A) 07/17/2009 0934   BILIRUBINUR NEGATIVE 07/17/2009 0934   KETONESUR NEGATIVE 07/17/2009 0934   PROTEINUR NEGATIVE 07/17/2009 0934   UROBILINOGEN 0.2 07/17/2009 0934   NITRITE NEGATIVE 07/17/2009 0934   LEUKOCYTESUR NEGATIVE 07/17/2009 0934    No results found for: LABMICR, WBCUA, RBCUA, LABEPIT, MUCUS, BACTERIA  Pertinent Imaging: KUb today: Images reviewed and discussed with the patient  No results  found for this or any previous visit.  No results found for this or any previous visit.  No results found for this or any previous visit.  No results found for this or any previous visit.  No results found for this or any previous visit.  No results found for this or any previous visit.  No results found for this or any previous visit.  No results found for this or any previous visit.   Assessment & Plan:    1. Kidney stones -We discussed the management of kidney stones. These options include observation, ureteroscopy, shockwave lithotripsy (ESWL) and percutaneous nephrolithotomy (PCNL). We discussed which options are relevant to the patient's stone(s). We discussed the natural history of kidney stones as well as the complications of untreated stones and the impact on quality of life without treatment as well as with each of the above listed treatments. We also discussed the efficacy of each treatment in its ability to clear the stone burden. With any of these management options I discussed the signs and symptoms of infection and the need for emergent treatment should these be experienced. For each option we discussed the ability of each procedure to clear the patient of their stone burden.   For observation I described the risks which include but are not limited to silent renal damage, life-threatening infection, need for emergent surgery, failure to pass stone and pain.   For ureteroscopy I described the risks which include bleeding, infection, damage to contiguous structures, positioning injury, ureteral stricture, ureteral avulsion, ureteral injury, need for prolonged ureteral stent, inability to perform ureteroscopy, need for an interval procedure, inability to clear stone burden, stent discomfort/pain, heart attack, stroke, pulmonary embolus and the inherent risks with general anesthesia.   For shockwave lithotripsy I described the risks which include arrhythmia, kidney contusion,  kidney hemorrhage, need for transfusion, pain, inability to adequately break up stone, inability to pass stone fragments, Steinstrasse, infection associated with obstructing stones, need for alternate surgical procedure, need for repeat shockwave lithotripsy, MI, CVA, PE and the inherent risks with anesthesia/conscious sedation.   For PCNL I described the risks including positioning injury, pneumothorax, hydrothorax, need for chest tube, inability to clear stone burden, renal laceration, arterial venous fistula or malformation, need for embolization of kidney, loss of kidney or renal function, need for repeat procedure, need for prolonged nephrostomy tube, ureteral avulsion, MI, CVA, PE and the inherent risks of general anesthesia.   - The patient would like to proceed with right ESWL   No follow-ups on file.  [name], MD  [facility] [place]      [1] No Known Allergies  "

## 2024-02-05 ENCOUNTER — Other Ambulatory Visit: Payer: Self-pay

## 2024-02-05 DIAGNOSIS — N200 Calculus of kidney: Secondary | ICD-10-CM

## 2024-02-14 ENCOUNTER — Encounter (HOSPITAL_COMMUNITY)
Admission: RE | Admit: 2024-02-14 | Discharge: 2024-02-14 | Disposition: A | Discharge status: Home / Self Care | Source: Ambulatory Visit | Attending: Urology | Admitting: Urology

## 2024-02-15 ENCOUNTER — Encounter (HOSPITAL_COMMUNITY): Payer: Self-pay

## 2024-02-15 ENCOUNTER — Other Ambulatory Visit: Payer: Self-pay

## 2024-02-19 ENCOUNTER — Encounter: Admission: RE | Payer: Self-pay

## 2024-02-19 ENCOUNTER — Ambulatory Visit (HOSPITAL_COMMUNITY)
Admission: RE | Admit: 2024-02-19 | Discharge: 2024-02-19 | Disposition: A | Discharge status: Home / Self Care | Attending: Urology | Admitting: Urology

## 2024-02-19 ENCOUNTER — Encounter (HOSPITAL_COMMUNITY): Payer: Self-pay | Admitting: Urology

## 2024-02-19 ENCOUNTER — Ambulatory Visit (HOSPITAL_COMMUNITY)

## 2024-02-19 DIAGNOSIS — N201 Calculus of ureter: Secondary | ICD-10-CM | POA: Insufficient documentation

## 2024-02-19 HISTORY — PX: EXTRACORPOREAL SHOCK WAVE LITHOTRIPSY: SHX1557

## 2024-02-19 SURGERY — LITHOTRIPSY, ESWL
Anesthesia: LOCAL | Laterality: Right

## 2024-02-19 MED ORDER — HYDROCODONE-ACETAMINOPHEN 5-325 MG PO TABS: 1.0000 | ORAL_TABLET | ORAL | 0 refills | Status: AC | PRN

## 2024-02-19 MED ORDER — TAMSULOSIN HCL 0.4 MG PO CAPS: 0.4000 mg | ORAL_CAPSULE | Freq: Every day | ORAL | 1 refills | Status: AC

## 2024-02-19 MED ORDER — LACTATED RINGERS IV SOLN: INTRAVENOUS | Status: DC

## 2024-02-19 MED ORDER — SODIUM CHLORIDE 0.9 % IV SOLN: INTRAVENOUS | Status: DC

## 2024-02-19 MED ORDER — OLMESARTAN MEDOXOMIL 20 MG PO TABS: 20.0000 mg | ORAL_TABLET | Freq: Every day | ORAL | 11 refills | Status: AC

## 2024-02-19 MED ORDER — DIPHENHYDRAMINE HCL 25 MG PO CAPS
25.0000 mg | ORAL_CAPSULE | ORAL | Status: AC
  Administered 2024-02-19: 25 mg via ORAL
  Filled 2024-02-19: qty 1

## 2024-02-19 MED ORDER — DIAZEPAM 5 MG PO TABS
10.0000 mg | ORAL_TABLET | Freq: Once | ORAL | Status: AC
  Administered 2024-02-19: 10 mg via ORAL
  Filled 2024-02-19: qty 2

## 2024-02-19 NOTE — Progress Notes (Signed)
 Patient able to void following ESWL.  Urine slightly blood tinged - no clots and pt denied any pain or difficulty voiding.  No redness or bruising to treatment site to right lower abdomen and pt denied having any pain.

## 2024-02-19 NOTE — Progress Notes (Signed)
 Called [employer] and informed them patient was told by Dr. Sherrilee not to hold his plavix , okay to keep taking it.  Patient had a dose at 0800 02/18/24.  [facility] states this should be okay.  After speaking to litho truck went ahead and gave benadryl  and valium  and started his IV.

## 2024-02-19 NOTE — Interval H&P Note (Signed)
 History and Physical Interval Note:  02/19/2024 8:42 AM  Kristopher Gonzalez  has presented today for surgery, with the diagnosis of right ureteral stone.  The various methods of treatment have been discussed with the patient and family. After consideration of risks, benefits and other options for treatment, the patient has consented to  Procedures: LITHOTRIPSY, ESWL (Right) as a surgical intervention.  The patient's history has been reviewed, patient examined, no change in status, stable for surgery.  I have reviewed the patient's chart and labs.  Questions were answered to the patient's satisfaction.     [name]

## 2024-02-19 NOTE — Discharge Instructions (Signed)
 [facility]

## 2024-02-20 ENCOUNTER — Encounter (HOSPITAL_COMMUNITY): Payer: Self-pay | Admitting: Urology

## 2024-03-14 ENCOUNTER — Other Ambulatory Visit: Payer: Self-pay | Admitting: Urology

## 2024-04-23 ENCOUNTER — Ambulatory Visit: Admitting: Cardiology
